# Patient Record
Sex: Female | Born: 1980 | Hispanic: No | Marital: Married | Smoking: Never smoker
Health system: Southern US, Community
[De-identification: ages and names within clinical notes are randomized; demographics above are authoritative.]

## PROBLEM LIST (undated history)

## (undated) ENCOUNTER — Inpatient Hospital Stay (HOSPITAL_COMMUNITY): Payer: Self-pay

## (undated) DIAGNOSIS — Z789 Other specified health status: Secondary | ICD-10-CM

## (undated) HISTORY — PX: NO PAST SURGERIES: SHX2092

---

## 2003-04-01 ENCOUNTER — Ambulatory Visit (HOSPITAL_COMMUNITY): Admission: RE | Admit: 2003-04-01 | Discharge: 2003-04-01 | Payer: Self-pay | Admitting: *Deleted

## 2003-05-31 ENCOUNTER — Inpatient Hospital Stay (HOSPITAL_COMMUNITY): Admission: AD | Admit: 2003-05-31 | Discharge: 2003-05-31 | Payer: Self-pay | Admitting: *Deleted

## 2003-06-04 ENCOUNTER — Inpatient Hospital Stay (HOSPITAL_COMMUNITY): Admission: AD | Admit: 2003-06-04 | Discharge: 2003-06-04 | Payer: Self-pay | Admitting: Obstetrics & Gynecology

## 2003-06-09 ENCOUNTER — Inpatient Hospital Stay (HOSPITAL_COMMUNITY): Admission: AD | Admit: 2003-06-09 | Discharge: 2003-06-11 | Payer: Self-pay | Admitting: Obstetrics and Gynecology

## 2003-06-09 ENCOUNTER — Inpatient Hospital Stay (HOSPITAL_COMMUNITY): Admission: AD | Admit: 2003-06-09 | Discharge: 2003-06-09 | Payer: Self-pay | Admitting: *Deleted

## 2013-11-19 NOTE — L&D Delivery Note (Signed)
Delivery Note Patient is 33 y.o. [id number] with chorioamnionitis   At 11:38 PM a viable female was delivered via Vaginal, Spontaneous Delivery (Presentation: ; Occiput Anterior).  APGAR: 8, 9; weight 6 lb 9.6 oz (2995 g).   Placenta status: Intact, Spontaneous.  Cord: 3 vessels with the following complications: None.  Anesthesia: None  Episiotomy: None Lacerations: 1st degree;Perineal Suture Repair: 4.0 monocryl figure of eight x 1 Est. Blood Loss (mL): 200  Mom to postpartum.  Baby to Couplet care / Skin to Skin.  Bethani Brugger ROCIO 09/11/2014, 3:50 AM

## 2014-02-08 ENCOUNTER — Other Ambulatory Visit: Payer: Self-pay

## 2014-04-06 ENCOUNTER — Other Ambulatory Visit: Payer: Self-pay | Admitting: Obstetrics & Gynecology

## 2014-04-06 DIAGNOSIS — O3680X0 Pregnancy with inconclusive fetal viability, not applicable or unspecified: Secondary | ICD-10-CM

## 2014-04-07 ENCOUNTER — Ambulatory Visit (INDEPENDENT_AMBULATORY_CARE_PROVIDER_SITE_OTHER): Payer: Self-pay

## 2014-04-07 ENCOUNTER — Other Ambulatory Visit: Payer: Self-pay | Admitting: Obstetrics & Gynecology

## 2014-04-07 DIAGNOSIS — O0930 Supervision of pregnancy with insufficient antenatal care, unspecified trimester: Secondary | ICD-10-CM

## 2014-04-07 DIAGNOSIS — O3680X0 Pregnancy with inconclusive fetal viability, not applicable or unspecified: Secondary | ICD-10-CM

## 2014-04-07 NOTE — Progress Notes (Signed)
U/S(18+2wks)-single active fetus, meas c/w LMP dates, cx appears closed (4.1cm), bilateral adnexa appears wnl, FHR- 159 bpm, posterior Gr 0 placenta, no major abnl noted female fetus

## 2014-04-14 ENCOUNTER — Encounter: Payer: Self-pay | Admitting: Adult Health

## 2014-04-14 ENCOUNTER — Encounter: Payer: Self-pay | Admitting: *Deleted

## 2014-05-27 ENCOUNTER — Other Ambulatory Visit (HOSPITAL_COMMUNITY): Payer: Self-pay | Admitting: Nurse Practitioner

## 2014-05-27 DIAGNOSIS — Z3689 Encounter for other specified antenatal screening: Secondary | ICD-10-CM

## 2014-05-27 LAB — OB RESULTS CONSOLE HEPATITIS B SURFACE ANTIGEN: Hepatitis B Surface Ag: NEGATIVE

## 2014-05-27 LAB — OB RESULTS CONSOLE RPR: RPR: NONREACTIVE

## 2014-05-27 LAB — OB RESULTS CONSOLE RUBELLA ANTIBODY, IGM: Rubella: IMMUNE

## 2014-05-27 LAB — OB RESULTS CONSOLE ANTIBODY SCREEN: Antibody Screen: NEGATIVE

## 2014-05-27 LAB — OB RESULTS CONSOLE HIV ANTIBODY (ROUTINE TESTING): HIV: NONREACTIVE

## 2014-05-27 LAB — OB RESULTS CONSOLE ABO/RH: RH TYPE: POSITIVE

## 2014-06-01 ENCOUNTER — Encounter (HOSPITAL_COMMUNITY): Payer: Self-pay

## 2014-06-01 ENCOUNTER — Ambulatory Visit (HOSPITAL_COMMUNITY)
Admission: RE | Admit: 2014-06-01 | Discharge: 2014-06-01 | Disposition: A | Discharge status: Home / Self Care | Payer: Self-pay | Source: Ambulatory Visit | Attending: Nurse Practitioner | Admitting: Nurse Practitioner

## 2014-06-01 ENCOUNTER — Other Ambulatory Visit (HOSPITAL_COMMUNITY): Payer: Self-pay | Admitting: Nurse Practitioner

## 2014-06-01 DIAGNOSIS — Z3689 Encounter for other specified antenatal screening: Secondary | ICD-10-CM | POA: Insufficient documentation

## 2014-08-13 LAB — OB RESULTS CONSOLE GBS: GBS: NEGATIVE

## 2014-09-06 ENCOUNTER — Inpatient Hospital Stay (HOSPITAL_COMMUNITY)
Admission: AD | Admit: 2014-09-06 | Discharge: 2014-09-06 | Disposition: A | Discharge status: Home / Self Care | Payer: Self-pay | Source: Ambulatory Visit | Attending: Family Medicine | Admitting: Family Medicine

## 2014-09-06 ENCOUNTER — Other Ambulatory Visit (HOSPITAL_COMMUNITY): Payer: Self-pay | Admitting: Physician Assistant

## 2014-09-06 ENCOUNTER — Encounter (HOSPITAL_COMMUNITY): Payer: Self-pay

## 2014-09-06 DIAGNOSIS — Z3A40 40 weeks gestation of pregnancy: Secondary | ICD-10-CM | POA: Insufficient documentation

## 2014-09-06 DIAGNOSIS — O471 False labor at or after 37 completed weeks of gestation: Secondary | ICD-10-CM | POA: Insufficient documentation

## 2014-09-06 DIAGNOSIS — O480 Post-term pregnancy: Secondary | ICD-10-CM

## 2014-09-06 HISTORY — DX: Other specified health status: Z78.9

## 2014-09-06 NOTE — Discharge Instructions (Signed)
Tercer trimestre de embarazo (Third Trimester of Pregnancy) El tercer trimestre va desde la semana29 hasta la 42, desde el sptimo hasta el noveno mes, y es la poca en la que el feto crece ms rpidamente. Hacia el final del noveno mes, el feto mide alrededor de 20pulgadas (45cm) de largo y pesa entre 6 y 10 libras (2,700 y 4,500kg).  CAMBIOS EN EL ORGANISMO Su organismo atraviesa por muchos cambios durante el embarazo, y estos varan de una mujer a otra.   Seguir aumentando de peso. Es de esperar que aumente entre 25 y 35libras (11 y 16kg) hacia el final del embarazo.  Podrn aparecer las primeras estras en las caderas, el abdomen y las mamas.  Puede tener necesidad de orinar con ms frecuencia porque el feto baja hacia la pelvis y ejerce presin sobre la vejiga.  Debido al embarazo podr sentir acidez estomacal con frecuencia.  Puede estar estreida, ya que ciertas hormonas enlentecen los movimientos de los msculos que empujan los desechos a travs de los intestinos.  Pueden aparecer hemorroides o abultarse e hincharse las venas (venas varicosas).  Puede sentir dolor plvico debido al aumento de peso y a que las hormonas del embarazo relajan las articulaciones entre los huesos de la pelvis. El dolor de espalda puede ser consecuencia de la sobrecarga de los msculos que soportan la postura.  Tal vez haya cambios en el cabello que pueden incluir su engrosamiento, crecimiento rpido y cambios en la textura. Adems, a algunas mujeres se les cae el cabello durante o despus del embarazo, o tienen el cabello seco o fino. Lo ms probable es que el cabello se le normalice despus del nacimiento del beb.  Las mamas seguirn creciendo y le dolern. A veces, puede haber una secrecin amarilla de las mamas llamada calostro.  El ombligo puede salir hacia afuera.  Puede sentir que le falta el aire debido a que se expande el tero.  Puede notar que el feto "baja" o lo siente ms bajo, en el  abdomen.  Puede tener una prdida de secrecin mucosa con sangre. Esto suele ocurrir en el trmino de unos pocos das a una semana antes de que comience el trabajo de parto.  El cuello del tero se vuelve delgado y blando (se borra) cerca de la fecha de parto. QU DEBE ESPERAR EN LOS EXMENES PRENATALES  Le harn exmenes prenatales cada 2semanas hasta la semana36. A partir de ese momento le harn exmenes semanales. Durante una visita prenatal de rutina:  La pesarn para asegurarse de que usted y el feto estn creciendo normalmente.  Le tomarn la presin arterial.  Le medirn el abdomen para controlar el desarrollo del beb.  Se escucharn los latidos cardacos fetales.  Se evaluarn los resultados de los estudios solicitados en visitas anteriores.  Le revisarn el cuello del tero cuando est prxima la fecha de parto para controlar si este se ha borrado. Alrededor de la semana36, el mdico le revisar el cuello del tero. Al mismo tiempo, realizar un anlisis de las secreciones del tejido vaginal. Este examen es para determinar si hay un tipo de bacteria, estreptococo Grupo B. El mdico le explicar esto con ms detalle. El mdico puede preguntarle lo siguiente:  Cmo le gustara que fuera el parto.  Cmo se siente.  Si siente los movimientos del beb.  Si ha tenido sntomas anormales, como prdida de lquido, sangrado, dolores de cabeza intensos o clicos abdominales.  Si tiene alguna pregunta. Otros exmenes o estudios de deteccin que pueden realizarse   durante el tercer trimestre incluyen lo siguiente:  Anlisis de sangre para controlar las concentraciones de hierro (anemia).  Controles fetales para determinar su salud, nivel de actividad y crecimiento. Si tiene alguna enfermedad o hay problemas durante el embarazo, le harn estudios. FALSO TRABAJO DE PARTO Es posible que sienta contracciones leves e irregulares que finalmente desaparecen. Se llaman contracciones de  Braxton Hicks o falso trabajo de parto. Las contracciones pueden durar horas, das o incluso semanas, antes de que el verdadero trabajo de parto se inicie. Si las contracciones ocurren a intervalos regulares, se intensifican o se hacen dolorosas, lo mejor es que la revise el mdico.  SIGNOS DE TRABAJO DE PARTO   Clicos de tipo menstrual.  Contracciones cada 5minutos o menos.  Contracciones que comienzan en la parte superior del tero y se extienden hacia abajo, a la zona inferior del abdomen y la espalda.  Sensacin de mayor presin en la pelvis o dolor de espalda.  Una secrecin de mucosidad acuosa o con sangre que sale de la vagina. Si tiene alguno de estos signos antes de la semana37 del embarazo, llame a su mdico de inmediato. Debe concurrir al hospital para que la controlen inmediatamente. INSTRUCCIONES PARA EL CUIDADO EN EL HOGAR   Evite fumar, consumir hierbas, beber alcohol y tomar frmacos que no le hayan recetado. Estas sustancias qumicas afectan la formacin y el desarrollo del beb.  Siga las indicaciones del mdico en relacin con el uso de medicamentos. Durante el embarazo, hay medicamentos que son seguros de tomar y otros que no.  Haga actividad fsica solo en la forma indicada por el mdico. Sentir clicos uterinos es un buen signo para detener la actividad fsica.  Contine comiendo alimentos que sanos con regularidad.  Use un sostn que le brinde buen soporte si le duelen las mamas.  No se d baos de inmersin en agua caliente, baos turcos ni saunas.  Colquese el cinturn de seguridad cuando conduzca.  No coma carne cruda ni queso sin cocinar; evite el contacto con las bandejas sanitarias de los gatos y la tierra que estos animales usan. Estos elementos contienen grmenes que pueden causar defectos congnitos en el beb.  Tome las vitaminas prenatales.  Si est estreida, pruebe un laxante suave (si el mdico lo autoriza). Consuma ms alimentos ricos en  fibra, como vegetales y frutas frescos y cereales integrales. Beba gran cantidad de lquido para mantener la orina de tono claro o color amarillo plido.  Dese baos de asiento con agua tibia para aliviar el dolor o las molestias causadas por las hemorroides. Use una crema para las hemorroides si el mdico la autoriza.  Si tiene venas varicosas, use medias de descanso. Eleve los pies durante 15minutos, 3 o 4veces por da. Limite la cantidad de sal en su dieta.  Evite levantar objetos pesados, use zapatos de tacones bajos y mantenga una buena postura.  Descanse con las piernas elevadas si tiene calambres o dolor de cintura.  Visite a su dentista si no lo ha hecho durante el embarazo. Use un cepillo de dientes blando para higienizarse los dientes y psese el hilo dental con suavidad.  Puede seguir manteniendo relaciones sexuales, a menos que el mdico le indique lo contrario.  No haga viajes largos excepto que sea absolutamente necesario y solo con la autorizacin del mdico.  Tome clases prenatales para entender, practicar y hacer preguntas sobre el trabajo de parto y el parto.  Haga un ensayo de la partida al hospital.  Prepare el bolso que   llevar al hospital.  Prepare la habitacin del beb.  Concurra a todas las visitas prenatales segn las indicaciones de su mdico. SOLICITE ATENCIN MDICA SI:  No est segura de que est en trabajo de parto o de que ha roto la bolsa de las aguas.  Tiene mareos.  Siente clicos leves, presin en la pelvis o dolor persistente en el abdomen.  Tiene nuseas, vmitos o diarrea persistentes.  Tiene secrecin vaginal con mal olor.  Siente dolor al orinar. SOLICITE ATENCIN MDICA DE INMEDIATO SI:   Tiene fiebre.  Tiene una prdida de lquido por la vagina.  Tiene sangrado o pequeas prdidas vaginales.  Siente dolor intenso o clicos en el abdomen.  Sube o baja de peso rpidamente.  Tiene dificultad para respirar y siente dolor de  pecho.  Sbitamente se le hinchan mucho el rostro, las manos, los tobillos, los pies o las piernas.  No ha sentido los movimientos del beb durante una hora.  Siente un dolor de cabeza intenso que no se alivia con medicamentos.  Hay cambios en la visin. Document Released: 08/15/2005 Document Revised: 11/10/2013 ExitCare Patient Information 2015 [employer]. This information is not intended to replace advice given to you by your health care provider. Make sure you discuss any questions you have with your health care provider.  

## 2014-09-06 NOTE — Progress Notes (Signed)
I assisted Sarah, RN with questions.  Eda H Royal Interpreter.

## 2014-09-06 NOTE — MAU Note (Addendum)
Per pt and Eda, spanish translator, pt's fourth baby, c/o bloody discharge that is mucusy, irregular ctx's. [facility] No gush of fluid.

## 2014-09-06 NOTE — Progress Notes (Signed)
Dr. Loreta AveAcosta notified of pt in MAU for labor eval. Contractions irregular, pt notin labor. Will d/c home with labor precautions.

## 2014-09-06 NOTE — Progress Notes (Signed)
I assisted Sarah ,RN with discharge instructions. Eda H Royal  Interpreter.

## 2014-09-09 ENCOUNTER — Inpatient Hospital Stay (HOSPITAL_COMMUNITY)
Admission: AD | Admit: 2014-09-09 | Discharge: 2014-09-09 | Disposition: A | Discharge status: Home / Self Care | Payer: Self-pay | Source: Ambulatory Visit | Attending: Obstetrics and Gynecology | Admitting: Obstetrics and Gynecology

## 2014-09-09 ENCOUNTER — Telehealth (HOSPITAL_COMMUNITY): Payer: Self-pay | Admitting: *Deleted

## 2014-09-09 ENCOUNTER — Encounter (HOSPITAL_COMMUNITY): Payer: Self-pay | Admitting: *Deleted

## 2014-09-09 ENCOUNTER — Ambulatory Visit (HOSPITAL_COMMUNITY)
Admission: RE | Admit: 2014-09-09 | Discharge: 2014-09-09 | Disposition: A | Discharge status: Home / Self Care | Payer: Self-pay | Source: Ambulatory Visit | Attending: Physician Assistant | Admitting: Physician Assistant

## 2014-09-09 DIAGNOSIS — O26893 Other specified pregnancy related conditions, third trimester: Secondary | ICD-10-CM

## 2014-09-09 DIAGNOSIS — Z3A40 40 weeks gestation of pregnancy: Secondary | ICD-10-CM | POA: Insufficient documentation

## 2014-09-09 DIAGNOSIS — O9989 Other specified diseases and conditions complicating pregnancy, childbirth and the puerperium: Secondary | ICD-10-CM | POA: Insufficient documentation

## 2014-09-09 DIAGNOSIS — O480 Post-term pregnancy: Secondary | ICD-10-CM | POA: Insufficient documentation

## 2014-09-09 DIAGNOSIS — N898 Other specified noninflammatory disorders of vagina: Secondary | ICD-10-CM

## 2014-09-09 LAB — AMNISURE RUPTURE OF MEMBRANE (ROM) NOT AT ARMC: Amnisure ROM: NEGATIVE

## 2014-09-09 NOTE — MAU Note (Signed)
C/o vaginal leaking around 2100 last night;

## 2014-09-09 NOTE — MAU Note (Signed)
Patient states she has been leaking fluid with bloody show during the night and this am. States she was having contractions during the night but none now. Reports good fetal movement.

## 2014-09-09 NOTE — Telephone Encounter (Signed)
Preadmission screen  

## 2014-09-09 NOTE — Discharge Instructions (Signed)
Contracciones de Braxton Hicks °(Braxton Hicks Contractions) °Durante el embarazo, pueden presentarse contracciones uterinas que no siempre indican que está en trabajo de parto.  °¿QUÉ SON LAS CONTRACCIONES DE BRAXTON HICKS?  °Las contracciones que se presentan antes del trabajo de parto se conocen como contracciones de Braxton Hicks o falso trabajo de parto. Hacia el final del embarazo (32 a 34 semanas), estas contracciones pueden aparecen con más frecuencia y volverse más intensas. No corresponden al trabajo de parto verdadero porque estas contracciones no producen el agrandamiento (la dilatación) y el afinamiento del cuello del útero. Algunas veces, es difícil distinguirlas del trabajo de parto verdadero porque en algunos casos pueden ser muy intensas, y las personas tienen diferentes niveles de tolerancia al dolor. No debe sentirse avergonzada si concurre al hospital con falso trabajo de parto. En ocasiones, la única forma de saber si el trabajo de parto es verdadero es que el médico determine si hay cambios en el cuello del útero. °Si no hay problemas prenatales u otras complicaciones de salud asociadas con el embarazo, no habrá inconvenientes si la envían a su casa con falso trabajo de parto y espera que comience el verdadero. °CÓMO DIFERENCIAR EL TRABAJO DE PARTO FALSO DEL VERDADERO °Falso trabajo de parto °· Las contracciones del falso trabajo de parto duran menos y no son tan intensas como las verdaderas. °· Generalmente son irregulares. °· A menudo, se sienten en la parte delantera de la parte baja del abdomen y en la ingle, °· y pueden desaparecer cuando camina o cambia de posición mientras está acostada. °· Las contracciones se vuelven más débiles y su duración es menor a medida que el tiempo transcurre. °· Por lo general, no se hacen progresivamente más intensas, regulares y cercanas entre sí como en el caso del trabajo de parto verdadero. °Verdadero trabajo de parto °· Las contracciones del verdadero  trabajo de parto duran de 30 a 70 segundos, son muy regulares y suelen volverse más intensas, y aumenta su frecuencia. °· No desaparecen cuando camina. °· La molestia generalmente se siente en la parte superior del útero y se extiende hacia la zona inferior del abdomen y hacia la cintura. °· El médico podrá examinarla para determinar si el trabajo de parto es verdadero. El examen mostrará si el cuello del útero se está dilatando y afinando. °LO QUE DEBE RECORDAR °· Continúe haciendo los ejercicios habituales y siga otras indicaciones que el médico le dé. °· Tome todos los medicamentos como le indicó el médico. °· Concurra a las visitas prenatales regulares. °· Coma y beba con moderación si cree que está en trabajo de parto. °· Si las contracciones de Braxton Hicks le provocan incomodidad: °¨ Cambie de posición: si está acostada o descansando, camine; si está caminando, descanse. °¨ Siéntese y descanse en una bañera con agua tibia. °¨ Beba 2 o 3 vasos de agua. La deshidratación puede provocar contracciones. °¨ Respire lenta y profundamente varias veces por hora. °¿CUÁNDO DEBO BUSCAR ASISTENCIA MÉDICA INMEDIATA? °Solicite atención médica de inmediato si: °· Las contracciones se intensifican, se hacen más regulares y cercanas entre sí. °· Tiene una pérdida de líquido por la vagina. °· Tiene fiebre. °· Elimina mucosidad manchada con sangre. °· Tiene una hemorragia vaginal abundante. °· Tiene dolor abdominal permanente. °· Tiene un dolor en la zona lumbar que nunca tuvo antes. °· Siente que la cabeza del bebé empuja hacia abajo y ejerce presión en la zona pélvica. °· El bebé no se mueve tanto como solía. °Document Released: 08/15/2005 Document Revised: 11/10/2013 °ExitCare® Patient   Information 2015 [place]. This information is not intended to replace advice given to you by your health care provider. Make sure you discuss any questions you have with your health care provider.  Evaluacin de los movimientos  fetales  (Fetal Movement Counts) Nombre del paciente: __________________________________________________ Meagan ChapmanFecha de parto estimada: ____________________ Meagan HammanLa evaluacin de los movimientos fetales es muy recomendable en los embarazos de alto riesgo, pero tambin es una buena idea que lo hagan todas las Horseshoe Bendembarazadas. El Firefightermdico le indicar que comience a contarlos a las 28 semanas de Elvastonembarazo. Los movimientos fetales suelen aumentar:   Despus de Animatoruna comida completa.  Despus de la actividad fsica.  Despus de comer o beber Graybar Electricalgo dulce o fro.  En reposo. Preste atencin cuando sienta que el beb est ms activo. Esto le ayudar a notar un patrn de ciclos de vigilia y sueo de su beb y cules son los factores que contribuyen a un aumento de los movimientos fetales. Es importante llevar a cabo un recuento de movimientos fetales, al mismo tiempo cada da, cuando el beb normalmente est ms activo.  CMO CONTAR LOS MOVIMIENTOS FETALES 1. Busque un lugar tranquilo y cmodo para sentarse o recostarse sobre el lado izquierdo. Al recostarse sobre su lado izquierdo, le proporciona una mejor circulacin de [name] y oxgeno al beb. 2. Anote el da y la hora en una hoja de papel o en un diario. 3. Comience contando las pataditas, revoloteos, chasquidos, vueltas o pinchazos en un perodo de 2 horas. Debe sentir al menos 10 movimientos en 2 horas. 4. Si no siente 10 movimientos en 2 horas, espere 2  3 horas y cuente de nuevo. Busque cambios en el patrn o si no cuenta lo suficiente en 2 horas. SOLICITE ATENCIN MDICA SI:   Siente menos de 10 pataditas en 2 horas, en dos intentos.  No hay movimientos durante una hora.  El patrn se modifica o le lleva ms tiempo Art gallery managercada da contar las 10 pataditas.  Siente que el beb no se mueve como lo hace habitualmente. Fecha: ____________ Movimientos: ____________ Meagan BornHora de inicio: ____________ Meagan BornHora de finalizacin: ____________  Meagan NonesFecha: ____________ Movimientos:  ____________ Meagan BornHora de inicio: ____________ Meagan BornHora de finalizacin: ____________  Meagan NonesFecha: ____________ Movimientos: ____________ Meagan BornHora de inicio: ____________ Meagan BornHora de finalizacin: ____________  Meagan NonesFecha: ____________ Movimientos: ____________ Meagan BornHora de inicio: ____________ Meagan BornHora de finalizacin: ____________  Meagan NonesFecha: ____________ Movimientos: ____________ Meagan BornHora de inicio: ____________ Mammie RussianHora de finalizacin: ____________  Meagan NonesFecha: ____________ Movimientos: ____________ Mammie RussianHora de inicio: ____________ Mammie RussianHora de finalizacin: ____________  Meagan NonesFecha: ____________ Movimientos: ____________ Mammie RussianHora de inicio: ____________ Mammie RussianHora de finalizacin: ____________  Meagan NonesFecha: ____________ Movimientos: ____________ Mammie RussianHora de inicio: ____________ Mammie RussianHora de finalizacin: ____________  Meagan NonesFecha: ____________ Movimientos: ____________ Mammie RussianHora de inicio: ____________ Mammie RussianHora de finalizacin: ____________  Meagan NonesFecha: ____________ Movimientos: ____________ Mammie RussianHora de inicio: ____________ Mammie RussianHora de finalizacin: ____________  Meagan NonesFecha: ____________ Movimientos: ____________ Mammie RussianHora de inicio: ____________ Mammie RussianHora de finalizacin: ____________  Meagan NonesFecha: ____________ Movimientos: ____________ Mammie RussianHora de inicio: ____________ Mammie RussianHora de finalizacin: ____________  Meagan NonesFecha: ____________ Movimientos: ____________ Mammie RussianHora de inicio: ____________ Mammie RussianHora de finalizacin: ____________  Meagan NonesFecha: ____________ Movimientos: ____________ Mammie RussianHora de inicio: ____________ Mammie RussianHora de finalizacin: ____________  Meagan NonesFecha: ____________ Movimientos: ____________ Mammie RussianHora de inicio: ____________ Mammie RussianHora de finalizacin: ____________  Meagan NonesFecha: ____________ Movimientos: ____________ Mammie RussianHora de inicio: ____________ Mammie RussianHora de finalizacin: ____________  Meagan NonesFecha: ____________ Movimientos: ____________ Mammie RussianHora de inicio: ____________ Mammie RussianHora de finalizacin: ____________  Meagan NonesFecha: ____________ Movimientos: ____________ Meagan BornHora de inicio: ____________ Mammie RussianHora de finalizacin: ____________  Meagan NonesFecha: ____________ Movimientos: ____________ Mammie RussianHora de inicio: ____________  Mammie RussianHora de finalizacin: ____________  Meagan NonesFecha:  ____________ Movimientos: ____________ Hora de inicio: ____________ Hora de finalización: ____________  °Fecha: ____________ Movimientos: ____________ Hora de inicio: ____________ Hora de finalización: ____________  °Fecha: ____________ Movimientos: ____________ Hora de inicio: ____________ Hora de finalización: ____________  °Fecha: ____________ Movimientos: ____________ Hora de inicio: ____________ Hora de finalización: ____________  °Fecha: ____________ Movimientos: ____________ Hora de inicio: ____________ Hora de finalización: ____________  °Fecha: ____________ Movimientos: ____________ Hora de inicio: ____________ Hora de finalización: ____________  °Fecha: ____________ Movimientos: ____________ Hora de inicio: ____________ Hora de finalización: ____________  °Fecha: ____________ Movimientos: ____________ Hora de inicio: ____________ Hora de finalización: ____________  °Fecha: ____________ Movimientos: ____________ Hora de inicio: ____________ Hora de finalización: ____________  °Fecha: ____________ Movimientos: ____________ Hora de inicio: ____________ Hora de finalización: ____________  °Fecha: ____________ Movimientos: ____________ Hora de inicio: ____________ Hora de finalización: ____________  °Fecha: ____________ Movimientos: ____________ Hora de inicio: ____________ Hora de finalización: ____________  °Fecha: ____________ Movimientos: ____________ Hora de inicio: ____________ Hora de finalización: ____________  °Fecha: ____________ Movimientos: ____________ Hora de inicio: ____________ Hora de finalización: ____________  °Fecha: ____________ Movimientos: ____________ Hora de inicio: ____________ Hora de finalización: ____________  °Fecha: ____________ Movimientos: ____________ Hora de inicio: ____________ Hora de finalización: ____________  °Fecha: ____________ Movimientos: ____________ Hora de inicio: ____________ Hora de finalización: ____________  °Fecha:  ____________ Movimientos: ____________ Hora de inicio: ____________ Hora de finalización: ____________  °Fecha: ____________ Movimientos: ____________ Hora de inicio: ____________ Hora de finalización: ____________  °Fecha: ____________ Movimientos: ____________ Hora de inicio: ____________ Hora de finalización: ____________  °Fecha: ____________ Movimientos: ____________ Hora de inicio: ____________ Hora de finalización: ____________  °Fecha: ____________ Movimientos: ____________ Hora de inicio: ____________ Hora de finalización: ____________  °Fecha: ____________ Movimientos: ____________ Hora de inicio: ____________ Hora de finalización: ____________  °Fecha: ____________ Movimientos: ____________ Hora de inicio: ____________ Hora de finalización: ____________  °Fecha: ____________ Movimientos: ____________ Hora de inicio: ____________ Hora de finalización: ____________  °Fecha: ____________ Movimientos: ____________ Hora de inicio: ____________ Hora de finalización: ____________  °Fecha: ____________ Movimientos: ____________ Hora de inicio: ____________ Hora de finalización: ____________  °Fecha: ____________ Movimientos: ____________ Hora de inicio: ____________ Hora de finalización: ____________  °Fecha: ____________ Movimientos: ____________ Hora de inicio: ____________ Hora de finalización: ____________  °Fecha: ____________ Movimientos: ____________ Hora de inicio: ____________ Hora de finalización: ____________  °Fecha: ____________ Movimientos: ____________ Hora de inicio: ____________ Hora de finalización: ____________  °Fecha: ____________ Movimientos: ____________ Hora de inicio: ____________ Hora de finalización: ____________  °Fecha: ____________ Movimientos: ____________ Hora de inicio: ____________ Hora de finalización: ____________  °Fecha: ____________ Movimientos: ____________ Hora de inicio: ____________ Hora de finalización: ____________  °Fecha: ____________ Movimientos: ____________ Hora  de inicio: ____________ Hora de finalización: ____________  °Fecha: ____________ Movimientos: ____________ Hora de inicio: ____________ Hora de finalización: ____________  °Fecha: ____________ Movimientos: ____________ Hora de inicio: ____________ Hora de finalización: ____________  °Document Released: 02/12/2008 Document Revised: 10/22/2012 °ExitCare® Patient Information ©2015 [employer], LLC. This information is not intended to replace advice given to you by your health care provider. Make sure you discuss any questions you have with your health care provider. ° °

## 2014-09-10 ENCOUNTER — Inpatient Hospital Stay (HOSPITAL_COMMUNITY)
Admission: AD | Admit: 2014-09-10 | Discharge: 2014-09-12 | DRG: 775 | Disposition: A | Discharge status: Home / Self Care | Payer: Medicaid Other | Source: Ambulatory Visit | Attending: Obstetrics and Gynecology | Admitting: Obstetrics and Gynecology

## 2014-09-10 ENCOUNTER — Encounter (HOSPITAL_COMMUNITY): Payer: Self-pay | Admitting: *Deleted

## 2014-09-10 DIAGNOSIS — O09523 Supervision of elderly multigravida, third trimester: Secondary | ICD-10-CM

## 2014-09-10 DIAGNOSIS — O4290 Premature rupture of membranes, unspecified as to length of time between rupture and onset of labor, unspecified weeks of gestation: Secondary | ICD-10-CM | POA: Diagnosis present

## 2014-09-10 DIAGNOSIS — Z3A40 40 weeks gestation of pregnancy: Secondary | ICD-10-CM | POA: Diagnosis present

## 2014-09-10 DIAGNOSIS — O411230 Chorioamnionitis, third trimester, not applicable or unspecified: Secondary | ICD-10-CM | POA: Diagnosis present

## 2014-09-10 DIAGNOSIS — O471 False labor at or after 37 completed weeks of gestation: Secondary | ICD-10-CM | POA: Diagnosis present

## 2014-09-10 LAB — POCT FERN TEST: POCT Fern Test: POSITIVE

## 2014-09-10 LAB — CBC
HCT: 33.6 % — ABNORMAL LOW (ref 36.0–46.0)
Hemoglobin: 11.6 g/dL — ABNORMAL LOW (ref 12.0–15.0)
MCH: 29.8 pg (ref 26.0–34.0)
MCHC: 34.5 g/dL (ref 30.0–36.0)
MCV: 86.4 fL (ref 78.0–100.0)
Platelets: 149 10*3/uL — ABNORMAL LOW (ref 150–400)
RBC: 3.89 MIL/uL (ref 3.87–5.11)
RDW: 14 % (ref 11.5–15.5)
WBC: 15.2 10*3/uL — ABNORMAL HIGH (ref 4.0–10.5)

## 2014-09-10 MED ORDER — LIDOCAINE HCL (PF) 1 % IJ SOLN
30.0000 mL | INTRAMUSCULAR | Status: DC | PRN
  Administered 2014-09-10: 30 mL via SUBCUTANEOUS
  Filled 2014-09-10: qty 30

## 2014-09-10 MED ORDER — OXYTOCIN BOLUS FROM INFUSION: 500.0000 mL | INTRAVENOUS | Status: DC

## 2014-09-10 MED ORDER — OXYCODONE-ACETAMINOPHEN 5-325 MG PO TABS: 2.0000 | ORAL_TABLET | ORAL | Status: DC | PRN

## 2014-09-10 MED ORDER — CITRIC ACID-SODIUM CITRATE 334-500 MG/5ML PO SOLN
30.0000 mL | ORAL | Status: DC | PRN
Start: 2014-09-10 — End: 2014-09-11

## 2014-09-10 MED ORDER — LACTATED RINGERS IV SOLN
INTRAVENOUS | Status: DC
  Administered 2014-09-10: 22:00:00 via INTRAVENOUS

## 2014-09-10 MED ORDER — ACETAMINOPHEN 500 MG PO TABS: 1000.0000 mg | ORAL_TABLET | Freq: Four times a day (QID) | ORAL | Status: DC | PRN

## 2014-09-10 MED ORDER — GENTAMICIN SULFATE 40 MG/ML IJ SOLN
170.0000 mg | Freq: Three times a day (TID) | INTRAVENOUS | Status: DC
  Filled 2014-09-10 (×2): qty 4.25

## 2014-09-10 MED ORDER — GENTAMICIN SULFATE 40 MG/ML IJ SOLN
180.0000 mg | Freq: Once | INTRAVENOUS | Status: DC
  Filled 2014-09-10: qty 4.5

## 2014-09-10 MED ORDER — OXYTOCIN 40 UNITS IN LACTATED RINGERS INFUSION - SIMPLE MED
62.5000 mL/h | INTRAVENOUS | Status: DC
  Administered 2014-09-10: 999 mL/h via INTRAVENOUS
  Filled 2014-09-10: qty 1000

## 2014-09-10 MED ORDER — LACTATED RINGERS IV SOLN
500.0000 mL | INTRAVENOUS | Status: DC | PRN
  Administered 2014-09-10: 1000 mL via INTRAVENOUS

## 2014-09-10 MED ORDER — ONDANSETRON HCL 4 MG/2ML IJ SOLN
4.0000 mg | Freq: Four times a day (QID) | INTRAMUSCULAR | Status: DC | PRN
Start: 2014-09-10 — End: 2014-09-11

## 2014-09-10 MED ORDER — ACETAMINOPHEN 500 MG PO TABS: 1000.0000 mg | ORAL_TABLET | Freq: Once | ORAL | Status: DC

## 2014-09-10 MED ORDER — ACETAMINOPHEN 325 MG PO TABS
650.0000 mg | ORAL_TABLET | ORAL | Status: DC | PRN
  Administered 2014-09-10: 650 mg via ORAL
  Filled 2014-09-10: qty 2

## 2014-09-10 MED ORDER — SODIUM CHLORIDE 0.9 % IV SOLN
2.0000 g | Freq: Four times a day (QID) | INTRAVENOUS | Status: DC
  Administered 2014-09-10: 2 g via INTRAVENOUS
  Filled 2014-09-10 (×3): qty 2000

## 2014-09-10 MED ORDER — OXYCODONE-ACETAMINOPHEN 5-325 MG PO TABS
1.0000 | ORAL_TABLET | ORAL | Status: DC | PRN
Start: 2014-09-10 — End: 2014-09-11

## 2014-09-10 NOTE — MAU Note (Signed)
Pt reports she has been having ctx on and off all day got stronger. Water broke on the way here. Good fetal movement reported.

## 2014-09-10 NOTE — H&P (Signed)
Attestation of Attending Supervision of Advanced Practitioner: Evaluation and management procedures were performed by the PA/NP/CNM/OB Fellow under my supervision/collaboration. Chart reviewed and agree with management and plan.  Reyes Fifield V 09/10/2014 10:22 PM

## 2014-09-10 NOTE — H&P (Signed)
LABOR ADMISSION HISTORY AND PHYSICAL  Meagan Moore is a 33 y.o. female [phone] with IUP at 313w4d presenting w/ rupture of membranes. She reports +FMs, No LOF, no VB, no blurry vision, headaches or peripheral edema, and RUQ pain. She desires an epidural for labor pain control. She plans on breast feeding. She request nexplanon for birth control.  Estimated Date of Delivery: 09/06/14   Prenatal History/Complications:  Past Medical History: Past Medical History  Diagnosis Date  . Medical history non-contributory     Past Surgical History: Past Surgical History  Procedure Laterality Date  . No past surgeries      Obstetrical History: OB History   Grav Para Term Preterm Abortions TAB SAB Ect Mult Living   4 3 3       3       Social History: History   Social History  . Marital Status: Single    Spouse Name: N/A    Number of Children: N/A  . Years of Education: N/A   Social History Main Topics  . Smoking status: Never Smoker   . Smokeless tobacco: None  . Alcohol Use: No  . Drug Use: No  . Sexual Activity: None   Other Topics Concern  . None   Social History Narrative  . None    Family History: Family History  Problem Relation Age of Onset  . Alcohol abuse Neg Hx   . Arthritis Neg Hx   . Asthma Neg Hx   . Birth defects Neg Hx   . Cancer Neg Hx   . COPD Neg Hx   . Depression Neg Hx   . Diabetes Neg Hx   . Drug abuse Neg Hx   . Early death Neg Hx   . Hearing loss Neg Hx   . Heart disease Neg Hx   . Hyperlipidemia Neg Hx   . Hypertension Neg Hx   . Kidney disease Neg Hx   . Learning disabilities Neg Hx   . Mental illness Neg Hx   . Mental retardation Neg Hx   . Miscarriages / Stillbirths Neg Hx   . Stroke Neg Hx   . Vision loss Neg Hx   . Varicose Veins Neg Hx     Allergies: No Known Allergies  Prescriptions prior to admission  Medication Sig Dispense Refill  . loratadine (CLARITIN) 10 MG tablet Take 10 mg by mouth daily.      .  Prenatal Vit-Fe Fumarate-FA (PRENATAL MULTIVITAMIN) TABS tablet Take 1 tablet by mouth daily at 12 noon.         Review of Systems   All systems reviewed and negative except as stated in HPI  Blood pressure 123/52, pulse 118, temperature 99.5 F (37.5 C), resp. rate 20, last menstrual period 11/30/2013. General appearance: alert and cooperative Lungs: clear to auscultation bilaterally Heart: regular rate and rhythm Abdomen: soft, non-tender; bowel sounds normal Pelvic: adequate Extremities: Homans sign is negative, no sign of DVT Presentation: cephalic Fetal monitoringBaseline: 170 bpm, Variability: Good {> 6 bpm), Accelerations: Reactive and Decelerations: few variables Uterine activity q472min   Dilation: 1 Effacement (%): 50 Station: -2 Exam by:: Vira BlancoShimica Robinson, RN   Prenatal labs: ABO, Rh: O/Positive/-- (07/09 0000) Antibody: Negative (07/09 0000) Rubella:   RPR: Nonreactive (07/09 0000)  HBsAg: Negative (07/09 0000)  HIV: Non-reactive (07/09 0000)  GBS: Negative (09/25 0000)  1 hr Glucola 101 Genetic screening  Neg first screen and quad Anatomy US normal   Results for orders placed during the  hospital encounter of 09/10/14 (from the past 24 hour(s))  POCT FERN TEST   Collection Time    09/10/14  9:28 PM      Result Value Ref Range   POCT Fern Test Positive = ruptured amniotic membanes      There are no active problems to display for this patient.   Assessment: Meagan Moore is a 33 y.o. [phone] at 4072w4d here for spontaneous onset of labor and rupture of membranes  #Labor: progressing normally, if needed will augment with pitocin #Pain: Epidural upon request #FWB: cat II with baseline 170 #ID:  GBS neg #MOF: breast #MOC:nexplanon  Saul Dorsi ROCIO 09/10/2014, 10:02 PM

## 2014-09-10 NOTE — Progress Notes (Signed)
ANTIBIOTIC CONSULT NOTE - INITIAL  Pharmacy Consult for Gentamicin Indication: Chorioamnionitis  No Known Allergies  Patient Measurements: Height: 5\' 7"  (170.2 cm) Weight: 196 lb (88.905 kg) IBW/kg (Calculated) : 61.6 Adjusted Body Weight: 69.8 kg  Vital Signs: Temp: 99.5 F (37.5 C) (10/23 2047) BP: 135/72 mmHg (10/23 2313) Pulse Rate: 105 (10/23 2324) Intake/Output from previous day:   Intake/Output from this shift:    Labs:  Recent Labs  09/10/14 2150  WBC 15.2*  HGB 11.6*  PLT 149*   CrCl is unknown because no creatinine reading has been taken. No results found for this basename: VANCOTROUGH, Leodis BinetVANCOPEAK, VANCORANDOM, GENTTROUGH, GENTPEAK, GENTRANDOM, TOBRATROUGH, TOBRAPEAK, TOBRARND, AMIKACINPEAK, AMIKACINTROU, AMIKACIN,  in the last 72 hours   Microbiology: Recent Results (from the past 720 hour(s))  OB RESULTS CONSOLE GBS     Status: None   Collection Time    08/13/14 12:00 AM      Result Value Ref Range Status   GBS Negative   Final    Medical History: Past Medical History  Diagnosis Date  . Medical history non-contributory     Medications:  Ampicillin 2 Gm IV every 6 hours  Assessment: 33 yo G4P3003 at 106104w4d admitted for SROM. Pt now in labor with increased temperature and presumed chorioamnionitis.  Goal of Therapy:  Gentamicin peaks 6-8 mcg/ml; troughs <1 mcg/ml  Plan:  Gentamicin 180 mg IV loading dose; then 170 mg IV every 8 hours. Monitor serum creatinine per protocol Serum gentamicin levels as indicated.  Arelia SneddonMason, Terryon Pineiro Anne 09/10/2014,11:42 PM

## 2014-09-11 ENCOUNTER — Encounter (HOSPITAL_COMMUNITY): Payer: Self-pay | Admitting: *Deleted

## 2014-09-11 LAB — TYPE AND SCREEN
ABO/RH(D): O POS
ANTIBODY SCREEN: NEGATIVE

## 2014-09-11 LAB — HIV ANTIBODY (ROUTINE TESTING W REFLEX): HIV 1&2 Ab, 4th Generation: NONREACTIVE

## 2014-09-11 LAB — ABO/RH: ABO/RH(D): O POS

## 2014-09-11 LAB — RPR

## 2014-09-11 MED ORDER — DIPHENHYDRAMINE HCL 25 MG PO CAPS: 25.0000 mg | ORAL_CAPSULE | Freq: Four times a day (QID) | ORAL | Status: DC | PRN

## 2014-09-11 MED ORDER — BENZOCAINE-MENTHOL 20-0.5 % EX AERO: 1.0000 "application " | INHALATION_SPRAY | CUTANEOUS | Status: DC | PRN

## 2014-09-11 MED ORDER — INFLUENZA VAC SPLIT QUAD 0.5 ML IM SUSY: 0.5000 mL | PREFILLED_SYRINGE | INTRAMUSCULAR | Status: DC

## 2014-09-11 MED ORDER — IBUPROFEN 600 MG PO TABS
600.0000 mg | ORAL_TABLET | Freq: Four times a day (QID) | ORAL | Status: DC | PRN
Start: 2014-09-11 — End: 2014-09-12
  Administered 2014-09-11 – 2014-09-12 (×4): 600 mg via ORAL
  Filled 2014-09-11 (×5): qty 1

## 2014-09-11 MED ORDER — SIMETHICONE 80 MG PO CHEW: 80.0000 mg | CHEWABLE_TABLET | ORAL | Status: DC | PRN

## 2014-09-11 MED ORDER — INFLUENZA VAC SPLIT QUAD 0.5 ML IM SUSY
0.5000 mL | PREFILLED_SYRINGE | INTRAMUSCULAR | Status: AC
  Administered 2014-09-11: 0.5 mL via INTRAMUSCULAR

## 2014-09-11 MED ORDER — PRENATAL MULTIVITAMIN CH
1.0000 | ORAL_TABLET | Freq: Every day | ORAL | Status: DC
  Administered 2014-09-11 – 2014-09-12 (×2): 1 via ORAL
  Filled 2014-09-11 (×2): qty 1

## 2014-09-11 MED ORDER — IBUPROFEN 100 MG PO CHEW: 600.0000 mg | CHEWABLE_TABLET | Freq: Three times a day (TID) | ORAL | Status: DC

## 2014-09-11 MED ORDER — OXYCODONE-ACETAMINOPHEN 5-325 MG PO TABS: 1.0000 | ORAL_TABLET | ORAL | Status: DC | PRN

## 2014-09-11 MED ORDER — WITCH HAZEL-GLYCERIN EX PADS: 1.0000 "application " | MEDICATED_PAD | CUTANEOUS | Status: DC | PRN

## 2014-09-11 MED ORDER — ONDANSETRON HCL 4 MG/2ML IJ SOLN: 4.0000 mg | INTRAMUSCULAR | Status: DC | PRN

## 2014-09-11 MED ORDER — IBUPROFEN 600 MG PO TABS
600.0000 mg | ORAL_TABLET | Freq: Four times a day (QID) | ORAL | Status: DC
  Administered 2014-09-11: 600 mg via ORAL
  Filled 2014-09-11: qty 1

## 2014-09-11 MED ORDER — LANOLIN HYDROUS EX OINT: TOPICAL_OINTMENT | CUTANEOUS | Status: DC | PRN

## 2014-09-11 MED ORDER — ONDANSETRON HCL 4 MG PO TABS: 4.0000 mg | ORAL_TABLET | ORAL | Status: DC | PRN

## 2014-09-11 MED ORDER — TETANUS-DIPHTH-ACELL PERTUSSIS 5-2.5-18.5 LF-MCG/0.5 IM SUSP
0.5000 mL | Freq: Once | INTRAMUSCULAR | Status: DC
Start: 2014-09-12 — End: 2014-09-12

## 2014-09-11 MED ORDER — IBUPROFEN 600 MG PO TABS: 600.0000 mg | ORAL_TABLET | Freq: Four times a day (QID) | ORAL | Status: DC

## 2014-09-11 MED ORDER — DIBUCAINE 1 % RE OINT: 1.0000 "application " | TOPICAL_OINTMENT | RECTAL | Status: DC | PRN

## 2014-09-11 MED ORDER — ZOLPIDEM TARTRATE 5 MG PO TABS: 5.0000 mg | ORAL_TABLET | Freq: Every evening | ORAL | Status: DC | PRN

## 2014-09-11 MED ORDER — SENNOSIDES-DOCUSATE SODIUM 8.6-50 MG PO TABS
2.0000 | ORAL_TABLET | ORAL | Status: DC
  Administered 2014-09-11: 2 via ORAL
  Filled 2014-09-11: qty 2

## 2014-09-11 NOTE — Progress Notes (Signed)
Post Partum Day 1 Subjective:  Markus Jarvisvangelina T Maldonado is a 33 y.o. [id number] 4061w4d s/p SVD.  No acute events overnight.  Pt denies problems with  voiding or po intake.  She denies nausea or vomiting.  Pain is well controlled.  Plan for birth control is nexplanon.  Method of Feeding: breast  Objective: Blood pressure 107/62, pulse 82, temperature 98.3 F (36.8 C), temperature source Oral, resp. rate 20, height 5\' 7"  (1.702 m), weight 196 lb (88.905 kg), last menstrual period 11/30/2013, SpO2 100.00%, unknown if currently breastfeeding.  Physical Exam:  General: alert, cooperative and no distress Lochia:normal flow Chest: CTAB Heart: RRR no m/r/g Abdomen: +BS, soft, nontender,  Uterine Fundus: firm DVT Evaluation: No evidence of DVT seen on physical exam. Extremities: 1+ edema   Recent Labs  09/10/14 2150  HGB 11.6*  HCT 33.6*    Assessment/Plan:  ASSESSMENT: Markus Jarvisvangelina T Maldonado is a 33 y.o. [id number] 8061w4d s/p SVD  Plan for discharge tomorrow   LOS: 1 day   Ellias Mcelreath ROCIO 09/11/2014, 7:26 AM

## 2014-09-11 NOTE — Lactation Note (Signed)
This note was copied from the chart of Meagan Moore. Lactation Consultation Note  Patient Name: Meagan Moore [id number]'s Date: 09/11/2014 Reason for consult: Initial assessment of this mother/baby dyad at 20 hours postpartum.  Mom is an experienced breastfeeding mom, stating that she breastfed 3 other children for >1 year each.  Per mom, this newborn is latching well and she informs LC (who speaks Spanish) that she knows how to hand express her colostrum/milk.  LC encouraged frequent STS and at time of visit, baby is not cuing but is laying STS against mom's right breast.  Mom declines offer of interpreter, stating that she understands all the information [facility] by [facility].  Mom has been given written information in both [place] and [place].  LC encouraged review of Baby and Me pp 13-16 for review of BF information in [place].LC provided [employer] Resource brochure in Spanish, and reviewed WH services and list of community and web site resources, especially [facility] website which has information available in [place]..   Maternal Data Formula Feeding for Exclusion: No Has patient been taught Hand Expression?: Yes (experienced breastfeeding mom; informs LC that she knows how to hand express) Does the patient have breastfeeding experience prior to this delivery?: Yes  Feeding    LATCH Score/Interventions Latch: Repeated attempts needed to sustain latch, nipple held in mouth throughout feeding, stimulation needed to elicit sucking reflex. Intervention(s): Assist with latch  Audible Swallowing: Spontaneous and intermittent  Type of Nipple: Everted at rest and after stimulation  Comfort (Breast/Nipple): Soft / non-tender     Hold (Positioning): Assistance needed to correctly position infant at breast and maintain latch.  LATCH Score: 8 (most recent LATCH assessment, per RN)  Lactation Tools Discussed/Used   STS, hand expression, cue feedings  Consult Status Consult Status:  Follow-up Date: 09/12/14 Follow-up type: In-patient    Warrick ParisianBryant, Romario Tith [facility] 09/11/2014, 7:43 PM

## 2014-09-11 NOTE — Progress Notes (Signed)
I assisted Biomedical scientistHolly RN from Lactation with some questions and assesment for a baby. I also assisted Shari HeritageSue RN with some questions and assesment for mom , and vital signs. By Orlan LeavensViria Alvarez, Interpreter

## 2014-09-12 ENCOUNTER — Ambulatory Visit: Payer: Self-pay

## 2014-09-12 MED ORDER — IBUPROFEN 600 MG PO TABS: 600.0000 mg | ORAL_TABLET | Freq: Four times a day (QID) | ORAL | Status: DC | PRN

## 2014-09-12 NOTE — Progress Notes (Signed)
Assisted in RN in interpretation of teaching and immunization information.  Meagan Moore - [place] [employer] Interpre[employer]

## 2014-09-12 NOTE — Progress Notes (Signed)
Assisted in ordering patient dinner and breakfast. Joselyn GlassmanBenita Sanchez - Spanish Interpreter

## 2014-09-12 NOTE — Discharge Summary (Signed)
Obstetric Discharge Summary Reason for Admission: rupture of membranes Prenatal Procedures: ultrasound Intrapartum Procedures: spontaneous vaginal delivery Postpartum Procedures: none Complications-Operative and Postpartum: 1st degree perineal laceration Eating, drinking, voiding, ambulating well.  +flatus.  Lochia and pain wnl.  Denies dizziness, lightheadedness, or sob. No complaints. Breastfeeding well.   Hospital Course: Meagan Moore is a 33 y.o. 614P4004 female admited at 3240.4wks w/ SROM/early labor . She progressed rapidly on her own and had an uncomplicated SVB. Her pp course has been uncomplicated.  By PPD#2 she is doing well and is deemed to have received the full benefit of her hospital stay.  Filed Vitals:   09/12/14 0515  BP: 106/53  Pulse: 92  Temp: 97.7 F (36.5 C)  Resp: 18   H/H: Lab Results  Component Value Date/Time   HGB 11.6* 09/10/2014  9:50 PM   HCT 33.6* 09/10/2014  9:50 PM    Physical Exam: General: alert, cooperative and no distress Abdomen/Uterine Fundus: Appropriately tender, non-distended, FF @ U-2 Incision: n/a Lochia: appropriate Extremities: No evidence of DVT seen on physical exam. Negative Homan's sign, no cords, calf tenderness, or significant calf/ankle edema. 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   Discharge Diagnoses: Term Pregnancy-delivered  Discharge Information: Date: 05/31/2011 Activity: pelvic rest Diet: routine  Medications: PNV and Ibuprofen Breast feeding: Yes Contraception: abstinence until nexplanon Circumcision: n/a Condition: stable Instructions: refer to handout Discharge to: home  Infant: Home with mother  Follow-up Information   Schedule an appointment as soon as possible for a visit with [facility] [place]. (4-6 weeks for your postpartum visit and one for your nexplanon insertion)    Contact information:   [address] [place] [id number] [phone]      Marge DuncansBooker, Ryun Velez Randall, CNM, WHNP-BC 09/12/2014,7:18 AM

## 2014-09-12 NOTE — Progress Notes (Signed)
Checked on patients needs and ordered a snack Meagan Moore - [place] [employer] Interpre[employer]

## 2014-09-12 NOTE — Discharge Instructions (Signed)
Cuidados en el postparto luego de un parto vaginal  °(Postpartum Care After Vaginal Delivery) °Después del parto (período de postparto), la estadía normal en el hospital es de 24-72 horas. Si hubo problemas con el trabajo de parto o el parto, o si tiene otros problemas médicos, es posible que deba permanecer en el hospital por más tiempo.  °Mientras esté en el hospital, recibirá ayuda e instrucciones sobre cómo cuidar de usted misma y de su bebé recién nacido durante el postparto.  °Mientras esté en el hospital:  °· Asegúrese de decirle a las enfermeras si siente dolor o malestar, así como donde siente el dolor y qué empeora el dolor. °· Si usted tuvo una incisión cerca de la vagina (episiotomía) o si ha tenido algún desgarro durante el parto, las enfermeras le pondrán hielo sobre la episiotomía o el desgarro. Las bolsas de hielo pueden ayudar a reducir el dolor y la hinchazón. °· Si está amamantando, puede sentir contracciones dolorosas en el útero durante algunas semanas. Esto es normal. Las contracciones ayudan a que el útero vuelva a su tamaño normal. °· Es normal tener algo de sangrado después del parto. °¨ Durante los primeros 1-3 días después del parto, el flujo es de color rojo y la cantidad puede ser similar a un período. °¨ Es frecuente que el flujo se inicie y se detenga. °¨ En los primeros días, puede eliminar algunos coágulos pequeños. Informe a las enfermeras si elimina coágulos grandes o aumenta el flujo. °¨ No  elimine los coágulos de sangre por el inodoro antes de que la enfermera los vea. °¨ Durante los próximos 3 a 10 días después del parto, el flujo debe ser más acuoso y rosado o marrón. °¨ De diez a catorce días después del parto, el flujo debe ser una pequeña cantidad de secreción de color blanco amarillento. °¨ La cantidad de flujo disminuirá en las primeras semanas después del parto. El flujo puede detenerse en 6-8 semanas. La mayoría de las mujeres no tienen más flujo a las 12 semanas  después del parto. °· Usted debe cambiar sus apósitos con frecuencia. °· Lávese bien las manos con agua y jabón durante al menos 20 segundos después de cambiar el apósito, usar el baño o antes de sostener o alimentar a su recién nacido. °· Usted podrá sentir como que tiene que vaciar la vejiga durante las primeras 6-8 horas después del parto. °· En caso de que sienta debilidad, mareo o desmayo, llame a la enfermera antes de levantarse de la cama por primera vez y antes de tomar una ducha por primera vez. °· Dentro de los primeros días después del parto, sus mamas pueden comenzar a estar sensibles y llenas. Esto se llama congestión. La sensibilidad en los senos por lo general desaparece dentro de las 48-72 horas después de que ocurre la congestión. También puede notar que la leche se escapa de sus senos. Si no está amamantando no estimule sus pechos. La estimulación de las mamas hace que sus senos produzcan más leche. °· Pasar tanto tiempo como le sea posible con el bebé recién nacido es muy importante. Durante ese tiempo, usted y su bebé deben sentirse cerca y conocerse uno al otro. Tener al bebé en su habitación (alojamiento conjunto) ayudará a fortalecer el vínculo con el bebé recién nacido. Esto le dará tiempo para conocerlo y atenderlo de manera cómoda. °· Las hormonas se modifican después del parto. A veces, los cambios hormonales pueden causar tristeza o ganas de llorar por un tiempo. Estos sentimientos   no deben durar más de unos pocos días. Si duran más que eso, debe hablar con su médico. °· Si lo desea, hable con su médico acerca de los métodos de planificación familiar o métodos anticonceptivos. °· Hable con su médico acerca de las vacunas. El médico puede indicarle que se aplique las siguientes vacunas antes de salir del hospital: °· Vacuna contra el tétanos, la difteria y la tos ferina (Tdap) o el tétanos y la difteria (Td). Es muy importante que usted y su familia (incluyendo a los abuelos) u otras  personas que cuidan al recién nacido estén al día con las vacunas Tdap o Td. Las vacunas Tdap o Td pueden ayudar a proteger al recién nacido de enfermedades. °· Inmunización contra la rubéola. °· Inmunización contra la varicela. °· Inmunización contra la gripe. Usted debe recibir esta vacunación anual si no la ha recibido durante el embarazo. °Document Released: 09/02/2007 Document Revised: 07/30/2012 °ExitCare® Patient Information ©2015 [employer]. This information is not intended to replace advice given to you by your health care provider. Make sure you discuss any questions you have with your health care provider. ° °Lactancia materna °(Breastfeeding) °Decidir amamantar es una de las mejores elecciones que puede hacer por usted y su bebé. El cambio hormonal durante el embarazo produce el desarrollo del tejido mamario y aumenta la cantidad y el tamaño de los conductos galactóforos. Estas hormonas también permiten que las proteínas, los azúcares y las grasas de la sangre produzcan la leche materna en las glándulas productoras de leche. Las hormonas impiden que la leche materna sea liberada antes del nacimiento del bebé, además de impulsar el flujo de leche luego del nacimiento. Una vez que ha comenzado a amamantar, pensar en el bebé, así como la succión o el llanto, pueden estimular la liberación de leche de las glándulas productoras de leche.  °LOS BENEFICIOS DE AMAMANTAR °Para el bebé °· La primera leche (calostro) ayuda a mejorar el funcionamiento del sistema digestivo del bebé. °· La leche tiene anticuerpos que ayudan a prevenir las infecciones en el bebé. °· El bebé tiene una menor incidencia de asma, alergias y del síndrome de muerte súbita del lactante. °· Los nutrientes en la leche materna son mejores para el bebé que la leche maternizada y están preparados exclusivamente para cubrir las necesidades del bebé. °· La leche materna mejora el desarrollo cerebral del bebé. °· Es menos probable que el bebé  desarrolle otras enfermedades, como obesidad infantil, asma o diabetes mellitus de tipo 2. °Para usted  °· La lactancia materna favorece el desarrollo de un vínculo muy especial entre la madre y el bebé. °· Es conveniente. La leche materna siempre está disponible a la temperatura correcta y es económica. °· La lactancia materna ayuda a quemar calorías y a perder el peso ganado durante el embarazo. °· Favorece la contracción del útero al tamaño que tenía antes del embarazo de manera más rápida y disminuye el sangrado (loquios) después del parto. °· La lactancia materna contribuye a reducir el riesgo de desarrollar diabetes mellitus de tipo 2, osteoporosis o cáncer de mama o de ovario en el futuro. °SIGNOS DE QUE EL BEBÉ ESTÁ HAMBRIENTO °Primeros signos de hambre °· Aumenta su estado de alerta o actividad. °· Se estira. °· Mueve la cabeza de un lado a otro. °· Mueve la cabeza y abre la boca cuando se le toca la mejilla o la comisura de la boca (reflejo de búsqueda). °· Aumenta las vocalizaciones, tales como sonidos de succión, se relame los labios,   emite arrullos, suspiros, o chirridos. °· Mueve la mano hacia la boca. °· Se chupa con ganas los dedos o las manos. °Signos tardíos de hambre °· Está agitado. °· Llora de manera intermitente. °Signos de hambre extrema °Los signos de hambre extrema requerirán que lo calme y lo consuele antes de que el bebé pueda alimentarse adecuadamente. No espere a que se manifiesten los siguientes signos de hambre extrema para comenzar a amamantar:  °· Agitación. °· Llanto intenso y fuerte. °·  Gritos. °INFORMACIÓN BÁSICA SOBRE LA LACTANCIA MATERNA °Iniciación de la lactancia materna °· Encuentre un lugar cómodo para sentarse o acostarse, con un buen respaldo para el cuello y la espalda. °· Coloque una almohada o una manta enrollada debajo del bebé para acomodarlo a la altura de la mama (si está sentada). Las almohadas para amamantar se han diseñado especialmente a fin de servir de apoyo  para los brazos y el bebé mientras amamanta. °· Asegúrese de que el abdomen del bebé esté frente al suyo. °· Masajee suavemente la mama. Con las yemas de los dedos, masajee la pared del pecho hacia el pezón en un movimiento circular. Esto estimula el flujo de leche. Es posible que deba continuar este movimiento mientras amamanta si la leche fluye lentamente. °· Sostenga la mama con el pulgar por arriba del pezón y los otros 4 dedos por debajo de la mama. Asegúrese de que los dedos se encuentren lejos del pezón y de la boca del bebé. °· Empuje suavemente los labios del bebé con el pezón o con el dedo. °· Cuando la boca del bebé se abra lo suficiente, acérquelo rápidamente a la mama e introduzca todo el pezón y la zona oscura que lo rodea (areola), tanto como sea posible, dentro de la boca del bebé. °¨ Debe haber más areola visible por arriba del labio superior del bebé que por debajo del labio inferior. °¨ La lengua del bebé debe estar entre la encía inferior y la mama. °· Asegúrese de que la boca del bebé esté en la posición correcta alrededor del pezón (prendida). Los labios del bebé deben crear un sello sobre la mama y estar doblados hacia afuera (invertidos). °· Es común que el bebé succione durante 2 a 3 minutos para que comience el flujo de leche materna. °Cómo debe prenderse °Es muy importante que le enseñe al bebé cómo prenderse adecuadamente a la mama. Si el bebé no se prende adecuadamente, puede causarle dolor en el pezón y reducir la producción de leche materna, y hacer que el bebé tenga un escaso aumento de peso. Además, si el bebé no se prende adecuadamente al pezón, puede tragar aire durante la alimentación. Esto puede causarle molestias al bebé. Hacer eructar al bebé al cambiar de mama puede ayudarlo a liberar el aire. Sin embargo, enseñarle al bebé cómo prenderse a la mama adecuadamente es la mejor manera de evitar que se sienta molesto por tragar aire mientras se alimenta. °Signos de que el bebé se  ha prendido adecuadamente al pezón:  °· Tironea o succiona de modo silencioso, sin causarle dolor. °· Se escucha que traga cada 3 o 4 succiones. °·  Hay movimientos musculares por arriba y por delante de sus oídos al succionar. °Signos de que el bebé no se ha prendido adecuadamente al pezón:  °· Hace ruidos de succión o de chasquido mientras se alimenta. °· Siente dolor en el pezón. °Si cree que el bebé no se prendió correctamente, deslice el dedo en la comisura de la boca y colóquelo   las encas del beb para interrumpir la succin. Intente comenzar a amamantar nuevamente. Signos de Fish farm managerlactancia materna exitosa Signos del beb:   Disminuye gradualmente el nmero de succiones o cesa la succin por completo.  Se duerme.  Relaja el cuerpo.  Retiene una pequea cantidad de Kindred Healthcareleche en la boca.  Se desprende solo del pecho. Signos que presenta usted:  Las mamas han aumentado la firmeza, el peso y el tamao 1 a 3 horas despus de Museum/gallery exhibitions officeramamantar.  Estn ms blandas inmediatamente despus de amamantar.  Un aumento del volumen de [place] Forestleche, y tambin un cambio en su consistencia y color se producen hacia el quinto da de Tour managerlactancia materna.  Los pezones no duelen, ni estn agrietados ni sangran. Signos de que su beb recibe la cantidad de leche suficiente  Moja al menos 3 paales en 24 horas. La orina debe ser clara y de color amarillo plido a los 5 [address]  [address] de [place].  Defeca al menos 3 veces en 24 horas a los [address]  [address] de [address]. La materia fecal debe ser blanda y [place].  Defeca al menos 3 veces en 24 horas a los [address] de [address]. La materia fecal debe ser grumosa y [place]illenta.  No registra una prdida de peso mayor del 10% del peso al nacer durante los primeros 3 [address]  [address] de [place].  Aumenta de peso un promedio de 4 a 7onzas (113 a 198g) por semana despus de los [address]  [address] de vida.  Aumenta de [place]peso, Allemandiariamente, de [place] Pointmanera uniforme a Glass blower/designerpartir de los [address]  [address] de vida, sin Passenger transport managerregistrar prdida de peso despus de las  2semanas de vida. Despus de alimentarse, es posible que el beb regurgite una pequea cantidad. Esto es frecuente. FRECUENCIA Y DURACIN DE LA LACTANCIA MATERNA El amamantamiento frecuente la ayudar a producir ms [place]leche y a Education officer, communityprevenir problemas de Engineer, miningdolor en los pezones e hinchazn en las [place]amas. Alimente al beb cuando muestre signos de hambre o si siente la necesidad de reducir la congestin de las [place]amas. Esto se denomina "lactancia a demanda". Evite el uso del chupete mientras trabaja para establecer la lactancia (las primeras 4 a 6 semanas despus del nacimiento del beb). Despus de este perodo, podr ofrecerle un chupete. Las investigaciones demostraron que el uso del chupete durante el primer ao de vida del beb disminuye el riesgo de desarrollar el sndrome de muerte sbita del lactante (SMSL). Permita que el nio se alimente en cada mama todo lo que desee. Contine amamantando al beb hasta que haya terminado de alimentarse. Cuando el beb se desprende o se queda dormido mientras se est alimentando de la primera mama, ofrzcale la segunda. Debido a que, con frecuencia, los recin Sunoconacidos permanecen somnolientos las primeras semanas de vida, es posible que deba despertar al beb para alimentarlo. Los horarios de Acupuncturistlactancia varan de un beb a otro. Sin embargo, las siguientes reglas pueden servir como gua para ayudarla a Lawyergarantizar que el beb se alimenta adecuadamente:  Se puede amamantar a los recin nacidos (bebs de 4 semanas o menos de vida) cada 1 a 3 horas.  No deben transcurrir ms de 3 horas durante el da o 5 horas durante la noche sin que se amamante a los recin nacidos.  Debe amamantar al beb 8 veces como mnimo en un perodo de 24 horas, hasta que comience a introducir slidos en su dieta, a los 6 meses de vida aproximadamente. EXTRACCIN DE Dean Foods CompanyLECHE MATERNA La extraccin y Contractorel almacenamiento de la leche materna le permiten asegurarse de que el beb  bebé se alimente exclusivamente de  leche materna, aun en momentos en los que no puede amamantar. Esto tiene especial importancia si debe regresar al trabajo en el período en que aún está amamantando o si no puede estar presente en los momentos en que el bebé debe alimentarse. Su asesor en lactancia puede orientarla sobre cuánto tiempo es seguro almacenar leche materna.  °El sacaleche es un aparato que le permite extraer leche de la mama a un recipiente estéril. Luego, la leche materna extraída puede almacenarse en un refrigerador o congelador. Algunos sacaleches son manuales, mientras que otros son eléctricos. Consulte a su asesor en lactancia qué tipo será más conveniente para usted. Los sacaleches se pueden comprar; sin embargo, algunos hospitales y grupos de apoyo a la lactancia materna alquilan sacaleches mensualmente. Un asesor en lactancia puede enseñarle cómo extraer leche materna manualmente, en caso de que prefiera no usar un sacaleche.  °CÓMO CUIDAR LAS MAMAS DURANTE LA LACTANCIA MATERNA °Los pezones se secan, agrietan y duelen durante la lactancia materna. Las siguientes recomendaciones pueden ayudarla a mantener las mamas humectadas y sanas: °· Evite usar jabón en los pezones. °· Use un sostén de soporte. Aunque no son esenciales, las camisetas sin mangas o los sostenes especiales para amamantar están diseñados para acceder fácilmente a las mamas, para amamantar sin tener que quitarse todo el sostén o la camiseta. Evite usar sostenes con aro o sostenes muy ajustados. °· Seque al aire sus pezones durante 3 a 4 minutos después de amamantar al bebé. °· Utilice solo apósitos de algodón en el sostén para absorber las pérdidas de leche. La pérdida de un poco de leche materna entre las tomas es normal. °· Utilice lanolina sobre los pezones luego de amamantar. La lanolina ayuda a mantener la humedad normal de la piel. Si usa lanolina pura, no tiene que lavarse los pezones antes de volver a alimentar al bebé. La lanolina pura no es tóxica para el  bebé. Además, puede extraer manualmente algunas gotas de leche materna y masajear suavemente esa leche sobre los pezones, para que la leche se seque al aire. °Durante las primeras semanas después de dar a luz, algunas mujeres pueden experimentar hinchazón en las mamas (congestión mamaria). La congestión puede hacer que sienta las mamas pesadas, calientes y sensibles al tacto. El pico de la congestión ocurre dentro de los 3 a 5 días después del parto. Las siguientes recomendaciones pueden ayudarla a aliviar la congestión: °· Vacíe por completo las mamas al amamantar o extraer leche. Puede aplicar calor húmedo en las mamas (en la ducha o con toallas húmedas para manos) antes de amamantar o extraer leche. Esto aumenta la circulación y ayuda a que la leche fluya. Si el bebé no vacía por completo las mamas cuando lo amamanta, extraiga la leche restante después de que haya finalizado. °· Use un sostén ajustado (para amamantar o común) o una camiseta sin mangas durante 1 o 2 días para indicar al cuerpo que disminuya ligeramente la producción de leche. °· Aplique compresas de hielo sobre las mamas, a menos que le resulte demasiado incómodo. °· Asegúrese de que el bebé esté prendido y se encuentre en la posición correcta mientras lo alimenta. °Si la congestión persiste luego de 48 horas o después de seguir estas recomendaciones, comuníquese con su médico o un asesor en lactancia. °RECOMENDACIONES GENERALES PARA EL CUIDADO DE LA SALUD DURANTE LA LACTANCIA MATERNA °· Consuma alimentos saludables. Alterne comidas y colaciones, y coma 3 de cada una por día. Dado que lo que come   afecta la leche materna, es posible que algunas comidas hagan que su bebé se vuelva más irritable de lo habitual. Evite comer este tipo de alimentos si percibe que afectan de manera negativa al bebé. °· Beba leche, jugos de fruta y agua para satisfacer su sed (aproximadamente 10 vasos al día). °· Descanse con frecuencia, relájese y tome sus vitaminas  prenatales para evitar la fatiga, el estrés y la anemia. °· Continúe con los autocontroles de la mama. °· Evite masticar y fumar tabaco. °· Evite el consumo de alcohol y drogas. °Algunos medicamentos, que pueden ser perjudiciales para el bebé, pueden pasar a través de la leche materna. Es importante que consulte a su médico antes de tomar cualquier medicamento, incluidos todos los medicamentos recetados y de venta libre, así como los suplementos vitamínicos y herbales. °Puede quedar embarazada durante la lactancia. Si desea controlar la natalidad, consulte a su médico cuáles son las opciones más seguras para el bebé. °SOLICITE ATENCIÓN MÉDICA SI:  °· Usted siente que quiere dejar de amamantar o se siente frustrada con la lactancia. °· Siente dolor en las mamas o en los pezones. °· Sus pezones están agrietados o sangran. °· Sus pechos están irritados, sensibles o calientes. °· Tiene un área hinchada en cualquiera de las mamas. °· Siente escalofríos o fiebre. °· Tiene náuseas o vómitos. °· Presenta una secreción de otro líquido distinto de la leche materna de los pezones. °· Sus mamas no se llenan antes de amamantar al bebé para el quinto día después del parto. °· Se siente triste y deprimida. °· El bebé está demasiado somnoliento como para comer bien. °· El bebé tiene problemas para dormir. °· Moja menos de 3 pañales en 24 horas. °· Defeca menos de 3 veces en 24 horas. °· La piel del bebé o la parte blanca de los ojos se vuelven amarillentas. °· El bebé no ha aumentado de peso a los 5 días de vida. °SOLICITE ATENCIÓN MÉDICA DE INMEDIATO SI:  °· El bebé está muy cansado (letargo) y no se quiere despertar para comer. °· Le sube la fiebre sin causa. °Document Released: 11/05/2005 Document Revised: 11/10/2013 °ExitCare® Patient Information ©2015 Exit[employer]. This information is not intended to replace advice given to you by your health care provider. Make sure you discuss any questions you have with your health care  provider. ° °

## 2014-09-12 NOTE — Progress Notes (Signed)
Assisted RN  in discharge instructions interpretation.  [name] Mordecai MaesSanchez - [place] Tool WorksSpanish Interpreter

## 2014-09-12 NOTE — Lactation Note (Signed)
This note was copied from the chart of Meagan Moore. Lactation Consultation Note  Patient Name: Meagan Moore [id number]'s Date: 09/12/2014 Reason for consult: Follow-up assessment of baby's feeding and output record. Baby has been exclusively breastfeeding and this is an experienced multipara who breastfed 3 other babies >1 year each.  Baby has had 10-30 minute feedings today with LATCH score=9 per RN assessment.  There is only one stool and one void recorded for this baby and the RN, Fannie KneeSue has reviewed importance of recording feedings and output with help of interpreter tonight.  Baby was heavily meconium-stained at birth which may explain the delay in stooling and mom reported a wet diaper earlier today but it was not recorded by the RN during the day.  RN has arranged for interpreter to return tonight at 12 and 3 am to assist with any information or education needed.  RN to notify LC as needed, as well.   Maternal Data    Feeding Feeding Type: Breast Fed Length of feed: 18 min (with interpreter present when mom reported feed/benita)  LATCH Score/Interventions           [facility] RN assessment           Lactation Tools Discussed/Used   Mom keeping feeding and output record and has been instructed through interpreter, Meagan Moore and RN, Meagan Moore  Consult Status Consult Status: Follow-up Date: 09/13/14 Follow-up type: In-patient    Warrick ParisianBryant, Gaurav Baldree [facility] Centerarmly 09/12/2014, 8:34 PM

## 2014-09-12 NOTE — Progress Notes (Signed)
Assisted RN Fannie KneeSue with interpretation of instructions for baby care  [name] Mordecai MaesSanchez - Spanish Interpreter

## 2014-09-13 ENCOUNTER — Ambulatory Visit: Payer: Self-pay

## 2014-09-13 NOTE — Lactation Note (Addendum)
This note was copied from the chart of Meagan Moore. Lactation Consultation Note  Patient Name: Meagan Moore ZOXWR'UToday's Date: 09/13/2014 Reason for consult: Follow-up assessment LC into to speak to mom for discharge with Meagan Moore ( spanish interpreter )  Mom was trying to latch the baby and LC noted her difficulty, added support pillows for depth and  Baby was able to open wider. LC also noted mom to be boarder line engorged. LC had mom release breast  Down to make areola more compressible with hand expressing and pumping with a hand pump.  #24  flange to tight , switched up to #27 flange and noted it to be a better fit. Able to express 10 ml off, re-latched the baby  With improved depth . Per mom comfortable. Baby fed for 10 mins with multiply swallows, baby released , and LC noted  Baby still to be showing feeding cues . Assisted mom with latching with support on the right breast which had been leaking a large amount . Baby latched with depth with multiply swallows and still feeding after 10 mins.  LC reviewed sore nipple and engorgement prevention and tx. Mom expressed an understanding of tx of engorgement , also dad.  Mother informed of post-discharge support and given phone number to the lactation department, including services for phone call assistance;  out-patient appointments; and breastfeeding support group. List of other breastfeeding resources in the community given in the handout. Encouraged  mother to call for problems or concerns related to breastfeeding.   Maternal Data    Feeding Feeding Type: Breast Fed Length of feed: 15 min  LATCH Score/Interventions Latch: Grasps breast easily, tongue down, lips flanged, rhythmical sucking. Intervention(s): Adjust position;Assist with latch;Breast massage;Breast compression  Audible Swallowing: Spontaneous and intermittent  Type of Nipple: Everted at rest and after stimulation  Comfort (Breast/Nipple):  Filling, red/small blisters or bruises, mild/mod discomfort  Problem noted: Filling  Intervention(s): Breastfeeding basics reviewed;Support Pillows;Position options;Skin to skin     Lactation Tools Discussed/Used     Consult Status Consult Status: Follow-up Date: 09/13/14 Follow-up type: In-patient    Kathrin Greathouseorio, Zoraya Fiorenza Ann 09/13/2014, 1:39 PM

## 2014-09-13 NOTE — Progress Notes (Signed)
Ur chart review completed.  

## 2014-09-14 ENCOUNTER — Inpatient Hospital Stay (HOSPITAL_COMMUNITY): Admission: RE | Admit: 2014-09-14 | Payer: Self-pay | Source: Ambulatory Visit

## 2014-09-16 ENCOUNTER — Inpatient Hospital Stay (HOSPITAL_COMMUNITY): Admission: RE | Admit: 2014-09-16 | Payer: Self-pay | Source: Ambulatory Visit

## 2014-09-16 ENCOUNTER — Ambulatory Visit (HOSPITAL_COMMUNITY): Payer: Self-pay

## 2014-09-20 ENCOUNTER — Encounter (HOSPITAL_COMMUNITY): Payer: Self-pay | Admitting: *Deleted

## 2014-09-21 ENCOUNTER — Encounter (HOSPITAL_COMMUNITY): Payer: Self-pay

## 2014-09-28 ENCOUNTER — Encounter (HOSPITAL_COMMUNITY): Payer: Self-pay | Admitting: *Deleted

## 2014-09-28 ENCOUNTER — Encounter (HOSPITAL_COMMUNITY): Payer: Self-pay

## 2015-03-27 IMAGING — US US OB DETAIL+14 WK
1 of 2 series · 12 of 28 positions shown · non-contrast
Comparison: none

OBSTETRICS REPORT
                      (Signed Final 06/01/2014 [DATE])

             YASUTO
Service(s) Provided
 US OB DETAIL + 14 WK                                  76811.0
Indications
 Detailed fetal anatomic survey
 Advanced maternal age (AMA), Multigravida
Fetal Evaluation
 Num Of Fetuses:    1
 Fetal Heart Rate:  147                          bpm
 Cardiac Activity:  Observed
 Presentation:      Transverse, head to
                    maternal right
 Placenta:          Posterior, above cervical
                    os
 P. Cord            Visualized, central
 Insertion:
 Amniotic Fluid
 AFI FV:      Subjectively within normal limits
                                             Larg Pckt:    6.07  cm
Biometry
 BPD:     65.3  mm     G. Age:  26w 3d                CI:         73.4   70 - 86
                                                      FL/HC:      19.7   18.6 -
 HC:     242.2  mm     G. Age:  26w 2d       30  %    HC/AC:      1.08   1.04 -
 AC:     223.6  mm     G. Age:  26w 5d       61  %    FL/BPD:     73.0   71 - 87
 FL:      47.7  mm     G. Age:  26w 0d       31  %    FL/AC:      21.3   20 - 24
 HUM:     43.1  mm     G. Age:  25w 5d       37  %
 Est. FW:     933  gm      2 lb 1 oz     59  %
Gestational Age
 LMP:           26w 1d        Date:  11/30/13                 EDD:   09/06/14
 U/S Today:     26w 3d                                        EDD:   09/04/14
 Best:          26w 1d     Det. By:  LMP  (11/30/13)          EDD:   09/06/14
Anatomy
 Cranium:          Appears normal         Aortic Arch:      Not well visualized
 Fetal Cavum:      Appears normal         Ductal Arch:      Appears normal
 Ventricles:       Appears normal         Diaphragm:        Appears normal
 Choroid Plexus:   Appears normal         Stomach:          Appears normal, left
                                                            sided
 Cerebellum:       Appears normal         Abdomen:          Appears normal
 Posterior Fossa:  Appears normal         Abdominal Wall:   Not well visualized
 Nuchal Fold:      Not applicable (>20    Cord Vessels:     Appears normal (3
                   wks GA)                                  vessel cord)
 Face:             Appears normal         Kidneys:          Appear normal
                   (orbits and profile)
 Lips:             Appears normal         Bladder:          Appears normal
 Heart:            Appears normal         Spine:            Not well visualized
                   (4CH, axis, and
                   situs)
 RVOT:             Appears normal         Lower             Appears normal
                                          Extremities:
 LVOT:             Appears normal         Upper             Appears normal
 Other:  Fetus appears to be a female. Heels visualized. Technically difficult
         due to maternal habitus and fetal position.
Cervix Uterus Adnexa
 Cervical Length:    3.93     cm
 Cervix:       Not visualized (advanced GA >84wks)
 Uterus:       No abnormality visualized.
 Cul De Sac:   No free fluid seen.
 Left Ovary:    Within normal limits.
 Right Ovary:   Not visualized.
 Adnexa:     No abnormality visualized.
Impression
INDICATION: 34/35 yr old [id number] at 65w4d for fetal anatomic
 survey. Remote read.

[Series 1: us ob comp +14 wk · 81 acquisitions, 12 frames shown]
[im 4/81]
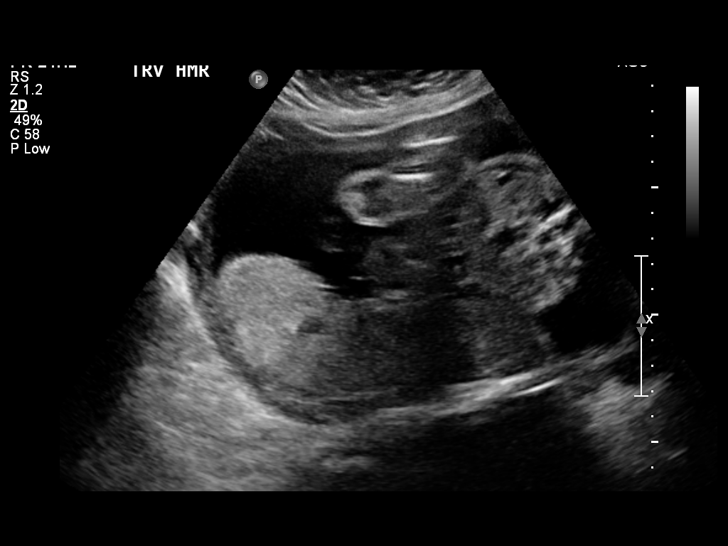
[im 10/81]
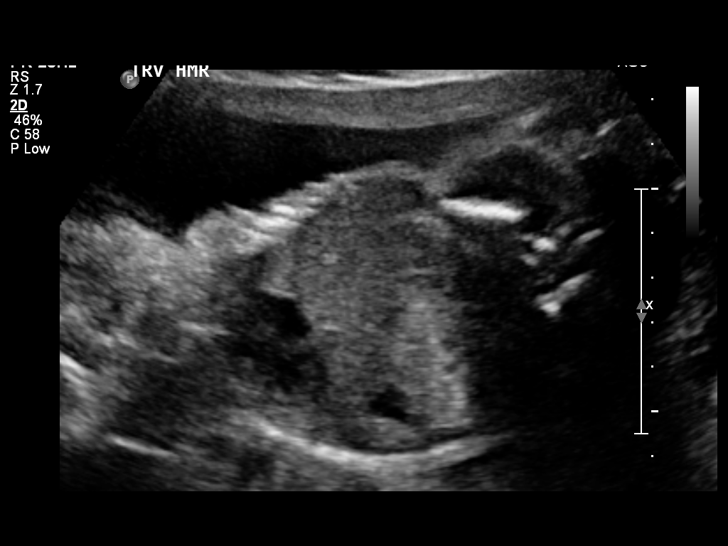
[im 16/81]
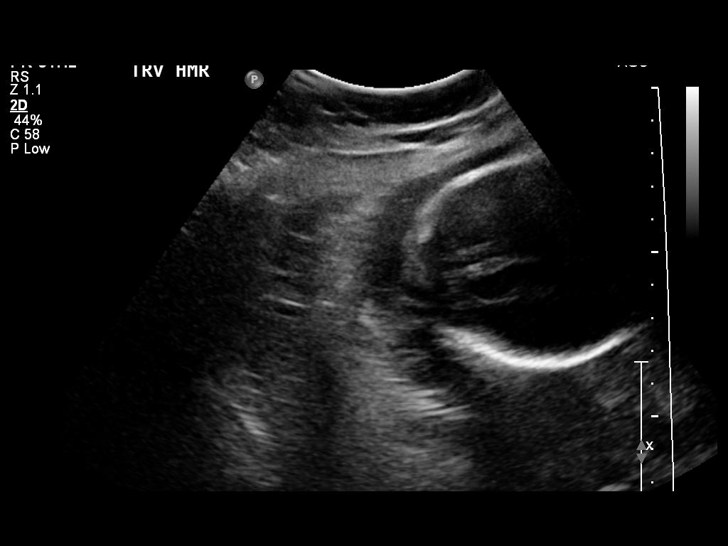
[im 25/81]
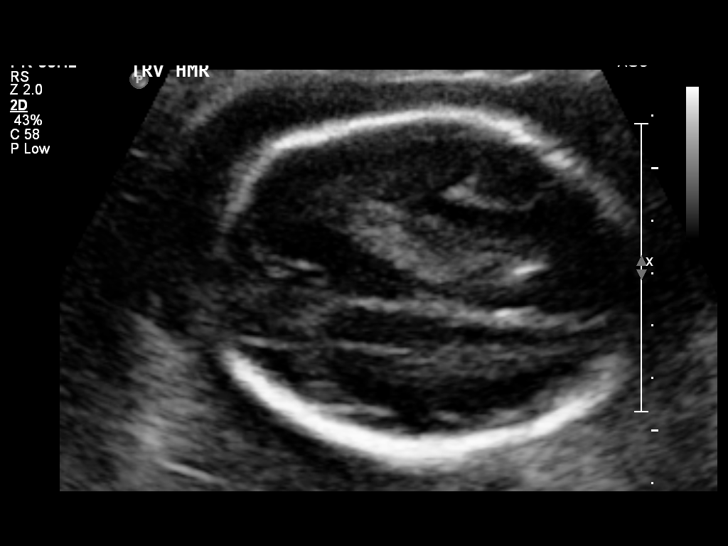
[im 31/81]
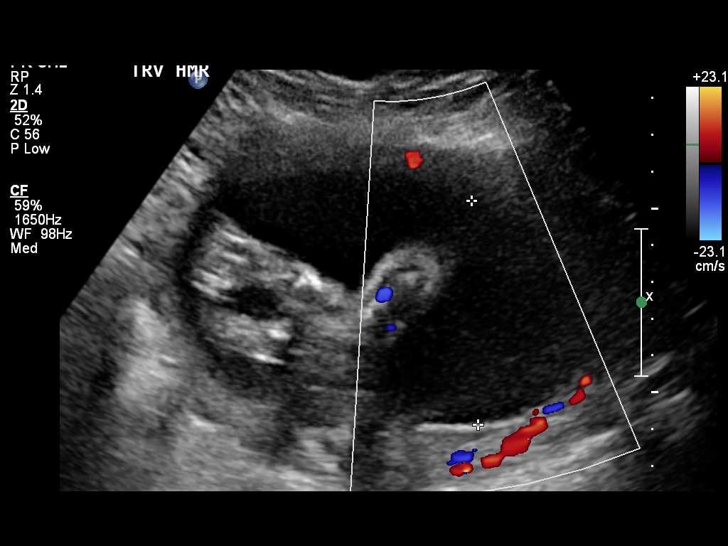
[im 37/81]
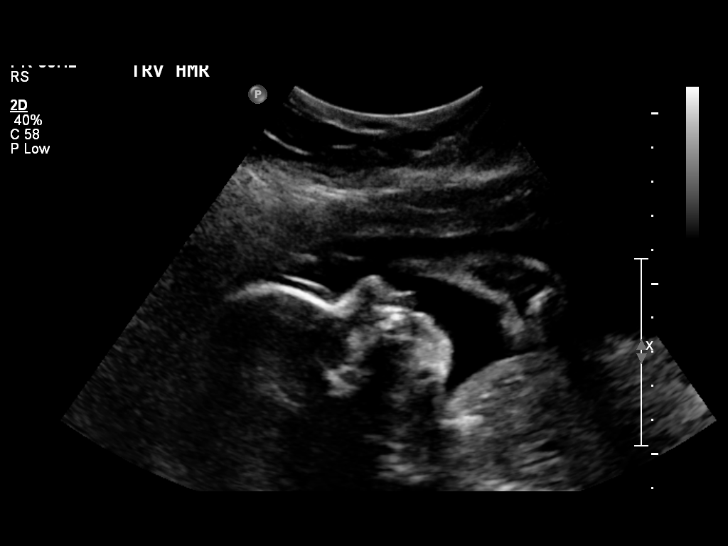
[im 47/81]
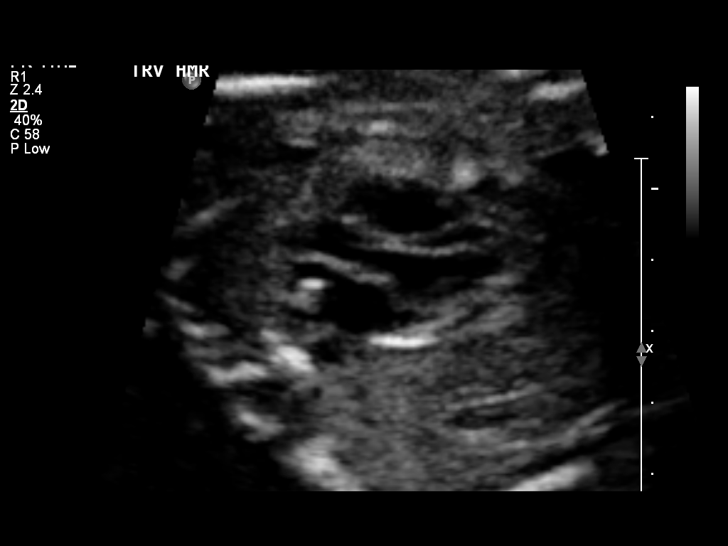
[im 53/81]
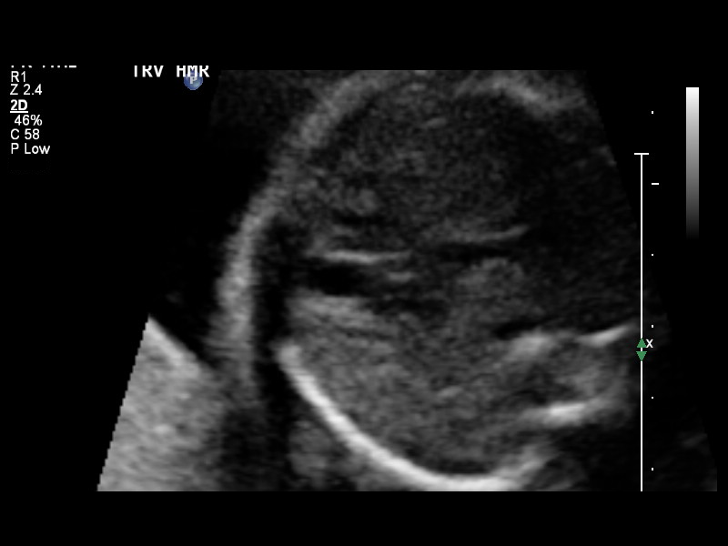
[im 59/81]
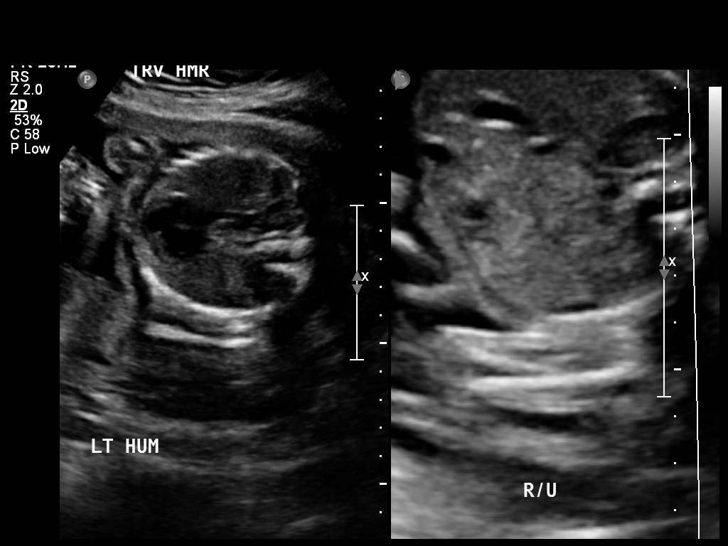
[im 68/81]
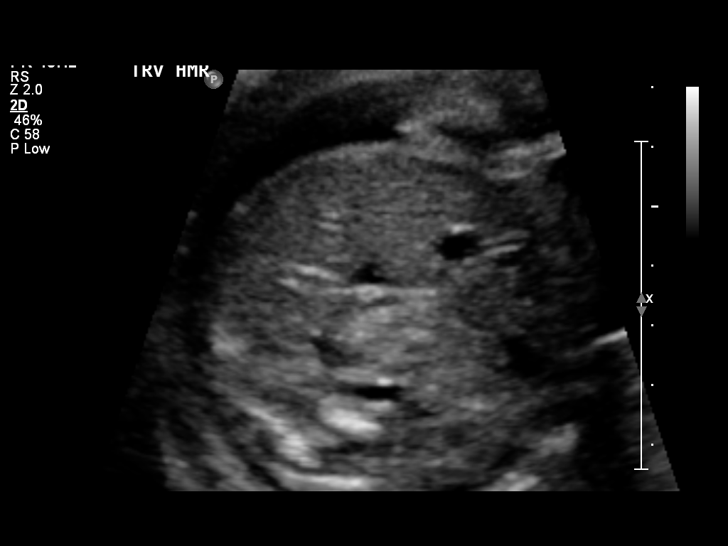
[im 74/81]
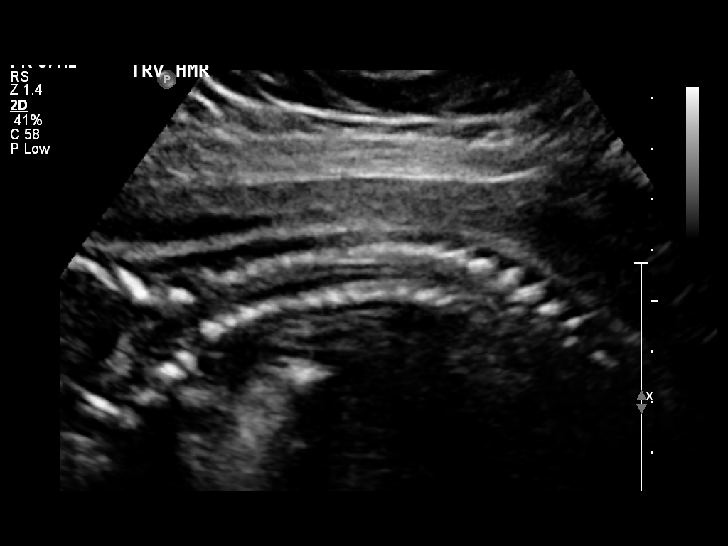
[im 81/81]
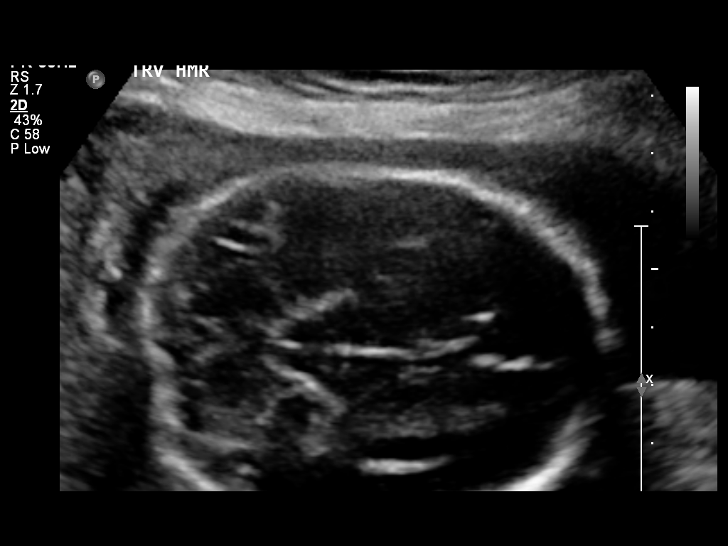

[12 of 28 positions shown; findings below may reference images not displayed]

FINDINGS: 1. Single intrauterine pregnancy.
 2. Estimated fetal weight is in the 59th%.
 3. Posterior placenta without evidence of previa.
 4. Normal amniotic fluid volume.
 5. Normal transabdominal cervical length.
 6. The views of the abdominal cord insertion, aortic arch, and
 spine are limited.
 7. The remainder of the limited anatomy survey is normal.
Recommendations

 1. Appropriate fetal growth.
 2. Limited anatomy survey:
 - recommend follow up in 3-4 weeks to complete anatomic
 survey
 3. Advanced maternal age
 YASUTO with us.  Please do not hesitate to

## 2015-07-12 ENCOUNTER — Encounter (HOSPITAL_COMMUNITY): Payer: Self-pay | Admitting: *Deleted

## 2017-01-03 ENCOUNTER — Emergency Department (HOSPITAL_COMMUNITY)
Admission: EM | Admit: 2017-01-03 | Discharge: 2017-01-03 | Disposition: A | Discharge status: Home / Self Care | Payer: Medicaid Other | Attending: Emergency Medicine | Admitting: Emergency Medicine

## 2017-01-03 ENCOUNTER — Encounter (HOSPITAL_COMMUNITY): Payer: Self-pay | Admitting: Emergency Medicine

## 2017-01-03 DIAGNOSIS — N611 Abscess of the breast and nipple: Secondary | ICD-10-CM | POA: Insufficient documentation

## 2017-01-03 LAB — I-STAT CG4 LACTIC ACID, ED: Lactic Acid, Venous: 1.77 mmol/L (ref 0.5–1.9)

## 2017-01-03 LAB — BASIC METABOLIC PANEL
ANION GAP: 8 (ref 5–15)
BUN: 8 mg/dL (ref 6–20)
CO2: 26 mmol/L (ref 22–32)
Calcium: 9.6 mg/dL (ref 8.9–10.3)
Chloride: 104 mmol/L (ref 101–111)
Creatinine, Ser: 0.61 mg/dL (ref 0.44–1.00)
GFR calc Af Amer: >60 mL/min (ref >60)
GFR calc non Af Amer: >60 mL/min (ref >60)
GLUCOSE: 119 mg/dL — AB (ref 65–99)
POTASSIUM: 4.5 mmol/L (ref 3.5–5.1)
Sodium: 138 mmol/L (ref 135–145)

## 2017-01-03 LAB — CBC
HCT: 34.1 % — ABNORMAL LOW (ref 36.0–46.0)
HEMOGLOBIN: 11 g/dL — AB (ref 12.0–15.0)
MCH: 27.8 pg (ref 26.0–34.0)
MCHC: 32.3 g/dL (ref 30.0–36.0)
MCV: 86.1 fL (ref 78.0–100.0)
Platelets: 319 10*3/uL (ref 150–400)
RBC: 3.96 MIL/uL (ref 3.87–5.11)
RDW: 12.4 % (ref 11.5–15.5)
WBC: 10.8 10*3/uL — ABNORMAL HIGH (ref 4.0–10.5)

## 2017-01-03 MED ORDER — CEPHALEXIN 500 MG PO CAPS: 500.0000 mg | ORAL_CAPSULE | Freq: Four times a day (QID) | ORAL | 0 refills | Status: DC

## 2017-01-03 NOTE — Discharge Instructions (Signed)
Por favor, tomar los antibiticos 4 veces por da durante [address]. Por favor vaya a [address] [place], [place] [address] y dgales que usted tiene un absceso mamario y la necesidad de tener imgenes. Hacer tomar Tylenol y motrin para [employer] dolor. Por favor, aplique compresas calientes. Nmero de telfono de la oficina es [phone]   Please take the antibiotics 4 times per day for 10 days. Please go to [address] [place], [place] [address] and tell them you have a breast abscess and need to have imaging. You make take Tylenol and motrin for pain. Please apply warm compresses.

## 2017-01-03 NOTE — ED Triage Notes (Signed)
Left breast abcess x 3 days , pt states is breast feeding , area is red , tender and quarter size

## 2017-01-03 NOTE — ED Provider Notes (Signed)
[facility]-EMERGENCY DEPT Provider Note   CSN: [id number] Arrival date & time: 01/03/17  1145     History   Chief Complaint Chief Complaint  Patient presents with  . Wound Infection    HPI Meagan Moore is a 36 y.o. female.  36 yo hispanic female with no sign pmh presents to the ED with breast abscess. Pt has been breast feeding her 2 1/2 year child. She noticed that her left breast become red and had a spot develop on her left breast 2 days ago and has worsened. She has not tried anything at home for the pain. Nothing makes better or worse. Pt states that her child feeds from the right breast. She has never latched onto her left breast. She has tried to use a breast pump on her left breast but has never gotten much mild return from it. She states she only gets a couple of drops. She denies any fever, chills, n/v/d, drainage from the site, hx or MRSA or any other associated symptoms.       Past Medical History:  Diagnosis Date  . Medical history non-contributory     Patient Active Problem List   Diagnosis Date Noted  . Premature rupture of membranes 09/10/2014    Past Surgical History:  Procedure Laterality Date  . NO PAST SURGERIES      OB History    Gravida Para Term Preterm AB Living   8 7 7  0 0 1   SAB TAB Ectopic Multiple Live Births   0 0 0   1       Home Medications    Prior to Admission medications   Medication Sig Start Date End Date Taking? Authorizing Provider  ibuprofen (ADVIL,MOTRIN) 600 MG tablet Take 1 tablet (600 mg total) by mouth every 6 (six) hours as needed for mild pain, moderate pain or cramping. 09/12/14   Cheral MarkerKimberly R Booker, CNM  loratadine (CLARITIN) 10 MG tablet Take 10 mg by mouth daily.    Historical Provider, MD  loratadine (CLARITIN) 10 MG tablet Take 10 mg by mouth daily.    Historical Provider, MD  Prenatal Vit-Fe Fumarate-FA (PRENATAL MULTIVITAMIN) TABS tablet Take 1 tablet by mouth daily at 12 noon.    Historical Provider,  MD  Prenatal Vit-Fe Fumarate-FA (PRENATAL MULTIVITAMIN) TABS tablet Take 1 tablet by mouth daily at 12 noon.    Historical Provider, MD    Family History Family History  Problem Relation Age of Onset  . Alcohol abuse Neg Hx   . Arthritis Neg Hx   . Asthma Neg Hx   . Birth defects Neg Hx   . Cancer Neg Hx   . COPD Neg Hx   . Depression Neg Hx   . Diabetes Neg Hx   . Drug abuse Neg Hx   . Early death Neg Hx   . Hearing loss Neg Hx   . Heart disease Neg Hx   . Hyperlipidemia Neg Hx   . Hypertension Neg Hx   . Kidney disease Neg Hx   . Learning disabilities Neg Hx   . Mental illness Neg Hx   . Mental retardation Neg Hx   . Miscarriages / Stillbirths Neg Hx   . Stroke Neg Hx   . Vision loss Neg Hx   . Varicose Veins Neg Hx     Social History Social History  Substance Use Topics  . Smoking status: Never Smoker  . Smokeless tobacco: Never Used  . Alcohol use No  Allergies   Patient has no known allergies.   Review of Systems Review of Systems  Constitutional: Negative for chills and fever.  Gastrointestinal: Negative for diarrhea, nausea and vomiting.  Skin: Positive for wound.  Neurological: Negative for syncope, weakness and headaches.  All other systems reviewed and are negative.    Physical Exam Updated Vital Signs BP 114/70   Pulse 79   Temp 98.1 F (36.7 C) (Oral)   Resp 16   SpO2 98%   Physical Exam  Constitutional: She appears well-developed and well-nourished. No distress.  Non toxic appearing.  HENT:  Head: Normocephalic and atraumatic.  Mouth/Throat: Oropharynx is clear and moist.  Eyes: Conjunctivae are normal. Right eye exhibits no discharge. Left eye exhibits no discharge. No scleral icterus.  Neck: Normal range of motion. Neck supple. No thyromegaly present.  Cardiovascular: Normal rate, regular rhythm, normal heart sounds and intact distal pulses.   Musculoskeletal: Normal range of motion.  Lymphadenopathy:    She has no cervical  adenopathy.  Neurological: She is alert.  Skin: Skin is warm and dry.  2x2 cm abscess noted to the left breast. Area of fluctuance. Surrounding area of induration. Area is warm to touch. No discharge. TTP.  Nipple without any signs of infection. No discharge from nipple/  Nursing note and vitals reviewed.      ED Treatments / Results  Labs (all labs ordered are listed, but only abnormal results are displayed) Labs Reviewed  BASIC METABOLIC PANEL - Abnormal; Notable for the following:       Result Value   Glucose, Bld 119 (*)    All other components within normal limits  CBC - Abnormal; Notable for the following:    WBC 10.8 (*)    Hemoglobin 11.0 (*)    HCT 34.1 (*)    All other components within normal limits  I-STAT CG4 LACTIC ACID, ED    EKG  EKG Interpretation None       Radiology No results found.  Procedures Procedures (including critical care time)  Medications Ordered in ED Medications - No data to display   Initial Impression / Assessment and Plan / ED Course  I have reviewed the triage vital signs and the nursing notes.  Pertinent labs & imaging results that were available during my care of the patient were reviewed by me and considered in my medical decision making (see chart for details).     Pt presents to the ED with left breast abscess. Pt breastfeeds but not from the affected breast. She has never been able to express much mild from her left breast. Exam concerning for mastitis and abscess. Do not feel comfortable draining area. Will treat with antibiotics. Pt is non toxic appearing. She is afebrile and not tachycardic. Lactic acid normal. No leukocytosis. Encourage warm compresses. Tylenol and motrin for pain. Start antibiotics. I call over to the breast center to get her an appointment tomorrow for a breast ultrasound and possible IandD. Breast centers states they do not see breast abscess pt. Spoke with Dr. Criss Alvine about pt who spoke with IR who  suggest sending pt to family medicine location tomorrow who will see pt and perform ultrasound tomorrow. Given pt information and address. Dicussed with pt to need to start abx and symptomatic treatment. Dr. Rayford Halsted agrees with the above plan. Pt is hemodynamically stable, in NAD, & able to ambulate in the ED. Pain has been managed & has no complaints prior to dc. Pt is comfortable with above plan  and is stable for discharge at this time. All questions were answered prior to disposition. Strict return precautions for f/u to the ED were discussed.   Final Clinical Impressions(s) / ED Diagnoses   Final diagnoses:  Breast abscess    New Prescriptions Discharge Medication List as of 01/03/2017  4:14 PM    START taking these medications   Details  cephALEXin (KEFLEX) 500 MG capsule Take 1 capsule (500 mg total) by mouth 4 (four) times daily., Starting Thu 01/03/2017, Print         Rise Mu, PA-C 01/03/17 2250    Pricilla Loveless, MD 01/04/17 [phone]

## 2017-09-19 ENCOUNTER — Other Ambulatory Visit (HOSPITAL_COMMUNITY): Payer: Self-pay | Admitting: Nurse Practitioner

## 2017-09-19 DIAGNOSIS — Z3A13 13 weeks gestation of pregnancy: Secondary | ICD-10-CM

## 2017-09-19 DIAGNOSIS — Z369 Encounter for antenatal screening, unspecified: Secondary | ICD-10-CM

## 2017-09-19 LAB — OB RESULTS CONSOLE HIV ANTIBODY (ROUTINE TESTING)
HIV: NONREACTIVE
HIV: NONREACTIVE

## 2017-09-19 LAB — OB RESULTS CONSOLE GC/CHLAMYDIA
Chlamydia: NEGATIVE
Chlamydia: NEGATIVE
GC PROBE AMP, GENITAL: NEGATIVE
Gonorrhea: NEGATIVE

## 2017-09-19 LAB — OB RESULTS CONSOLE RUBELLA ANTIBODY, IGM
Rubella: IMMUNE
Rubella: IMMUNE

## 2017-09-19 LAB — OB RESULTS CONSOLE HEPATITIS B SURFACE ANTIGEN
HEP B S AG: NEGATIVE
Hepatitis B Surface Ag: NEGATIVE

## 2017-09-25 ENCOUNTER — Other Ambulatory Visit (HOSPITAL_COMMUNITY): Payer: Self-pay | Admitting: Nurse Practitioner

## 2017-09-25 ENCOUNTER — Ambulatory Visit (HOSPITAL_COMMUNITY): Admission: RE | Admit: 2017-09-25 | Payer: Self-pay | Source: Ambulatory Visit

## 2017-09-25 ENCOUNTER — Ambulatory Visit (HOSPITAL_COMMUNITY)
Admission: RE | Admit: 2017-09-25 | Discharge: 2017-09-25 | Disposition: A | Discharge status: Home / Self Care | Payer: Self-pay | Source: Ambulatory Visit | Attending: Nurse Practitioner | Admitting: Nurse Practitioner

## 2017-09-25 ENCOUNTER — Encounter (HOSPITAL_COMMUNITY): Payer: Self-pay | Admitting: *Deleted

## 2017-09-25 DIAGNOSIS — O09521 Supervision of elderly multigravida, first trimester: Secondary | ICD-10-CM

## 2017-09-25 DIAGNOSIS — Z369 Encounter for antenatal screening, unspecified: Secondary | ICD-10-CM

## 2017-09-25 DIAGNOSIS — Z3A13 13 weeks gestation of pregnancy: Secondary | ICD-10-CM | POA: Insufficient documentation

## 2017-09-25 DIAGNOSIS — O09522 Supervision of elderly multigravida, second trimester: Secondary | ICD-10-CM | POA: Insufficient documentation

## 2017-09-25 DIAGNOSIS — Z3682 Encounter for antenatal screening for nuchal translucency: Secondary | ICD-10-CM | POA: Insufficient documentation

## 2017-11-19 NOTE — L&D Delivery Note (Signed)
Delivery Note At 9:49 PM a viable female was delivered via Vaginal, Spontaneous (Presentation: ROA).  APGAR: 9, 9; weight pending 1hr skin to skin.   Placenta status: Intact, marginal cord insertion .  Cord: 3V  with the following complications: None.  Cord pH: N/A  Anesthesia: None   Episiotomy: None Lacerations: None Suture Repair: None Est. Blood Loss (mL): 150  Mom to postpartum.  Baby to Couplet care / Skin to Skin.  Caryl Ada, DO 03/30/2018, 10:38 PM

## 2018-03-11 LAB — OB RESULTS CONSOLE GBS: GBS: NEGATIVE

## 2018-03-30 ENCOUNTER — Inpatient Hospital Stay (HOSPITAL_COMMUNITY)
Admission: AD | Admit: 2018-03-30 | Discharge: 2018-03-30 | Disposition: A | Discharge status: Home / Self Care | Payer: Medicaid Other | Source: Ambulatory Visit | Attending: Family Medicine | Admitting: Family Medicine

## 2018-03-30 ENCOUNTER — Inpatient Hospital Stay (HOSPITAL_COMMUNITY)
Admission: AD | Admit: 2018-03-30 | Discharge: 2018-04-01 | DRG: 807 | Disposition: A | Discharge status: Home / Self Care | Payer: Medicaid Other | Source: Ambulatory Visit | Attending: Obstetrics & Gynecology | Admitting: Obstetrics & Gynecology

## 2018-03-30 ENCOUNTER — Encounter (HOSPITAL_COMMUNITY): Payer: Self-pay | Admitting: *Deleted

## 2018-03-30 DIAGNOSIS — Z3A00 Weeks of gestation of pregnancy not specified: Secondary | ICD-10-CM

## 2018-03-30 DIAGNOSIS — Z3483 Encounter for supervision of other normal pregnancy, third trimester: Secondary | ICD-10-CM | POA: Diagnosis present

## 2018-03-30 DIAGNOSIS — O479 False labor, unspecified: Secondary | ICD-10-CM

## 2018-03-30 DIAGNOSIS — Z3A40 40 weeks gestation of pregnancy: Secondary | ICD-10-CM

## 2018-03-30 DIAGNOSIS — O43123 Velamentous insertion of umbilical cord, third trimester: Principal | ICD-10-CM | POA: Diagnosis present

## 2018-03-30 LAB — CBC
HEMATOCRIT: 38.2 % (ref 36.0–46.0)
HEMOGLOBIN: 12.1 g/dL (ref 12.0–15.0)
MCH: 29.1 pg (ref 26.0–34.0)
MCHC: 31.7 g/dL (ref 30.0–36.0)
MCV: 91.8 fL (ref 78.0–100.0)
Platelets: 191 10*3/uL (ref 150–400)
RBC: 4.16 MIL/uL (ref 3.87–5.11)
RDW: 15.2 % (ref 11.5–15.5)
WBC: 9 10*3/uL (ref 4.0–10.5)

## 2018-03-30 LAB — TYPE AND SCREEN
ABO/RH(D): O POS
ANTIBODY SCREEN: NEGATIVE

## 2018-03-30 MED ORDER — PRENATAL MULTIVITAMIN CH
1.0000 | ORAL_TABLET | Freq: Every day | ORAL | Status: DC
  Administered 2018-03-31: 1 via ORAL
  Filled 2018-03-30: qty 1

## 2018-03-30 MED ORDER — DIPHENHYDRAMINE HCL 25 MG PO CAPS: 25.0000 mg | ORAL_CAPSULE | Freq: Four times a day (QID) | ORAL | Status: DC | PRN

## 2018-03-30 MED ORDER — IBUPROFEN 600 MG PO TABS
600.0000 mg | ORAL_TABLET | Freq: Four times a day (QID) | ORAL | Status: DC
  Administered 2018-03-31 – 2018-04-01 (×6): 600 mg via ORAL
  Filled 2018-03-30 (×6): qty 1

## 2018-03-30 MED ORDER — OXYTOCIN 40 UNITS IN LACTATED RINGERS INFUSION - SIMPLE MED
INTRAVENOUS | Status: AC
  Filled 2018-03-30: qty 1000

## 2018-03-30 MED ORDER — ACETAMINOPHEN 325 MG PO TABS: 650.0000 mg | ORAL_TABLET | ORAL | Status: DC | PRN

## 2018-03-30 MED ORDER — MISOPROSTOL 200 MCG PO TABS
ORAL_TABLET | ORAL | Status: AC
  Filled 2018-03-30: qty 4

## 2018-03-30 MED ORDER — TERBUTALINE SULFATE 1 MG/ML IJ SOLN
0.2500 mg | Freq: Once | INTRAMUSCULAR | Status: DC | PRN
  Filled 2018-03-30: qty 1

## 2018-03-30 MED ORDER — OXYTOCIN 40 UNITS IN LACTATED RINGERS INFUSION - SIMPLE MED: 2.5000 [IU]/h | INTRAVENOUS | Status: DC

## 2018-03-30 MED ORDER — LIDOCAINE HCL (PF) 1 % IJ SOLN
INTRAMUSCULAR | Status: AC
  Filled 2018-03-30: qty 30

## 2018-03-30 MED ORDER — FENTANYL CITRATE (PF) 100 MCG/2ML IJ SOLN: 100.0000 ug | INTRAMUSCULAR | Status: DC | PRN

## 2018-03-30 MED ORDER — SIMETHICONE 80 MG PO CHEW: 80.0000 mg | CHEWABLE_TABLET | ORAL | Status: DC | PRN

## 2018-03-30 MED ORDER — ZOLPIDEM TARTRATE 5 MG PO TABS: 5.0000 mg | ORAL_TABLET | Freq: Every evening | ORAL | Status: DC | PRN

## 2018-03-30 MED ORDER — SENNOSIDES-DOCUSATE SODIUM 8.6-50 MG PO TABS
2.0000 | ORAL_TABLET | ORAL | Status: DC
  Administered 2018-03-31 (×2): 2 via ORAL
  Filled 2018-03-30 (×2): qty 2

## 2018-03-30 MED ORDER — WITCH HAZEL-GLYCERIN EX PADS: 1.0000 "application " | MEDICATED_PAD | CUTANEOUS | Status: DC | PRN

## 2018-03-30 MED ORDER — OXYTOCIN BOLUS FROM INFUSION: 500.0000 mL | Freq: Once | INTRAVENOUS | Status: DC

## 2018-03-30 MED ORDER — LIDOCAINE HCL (PF) 1 % IJ SOLN
30.0000 mL | INTRAMUSCULAR | Status: DC | PRN
  Filled 2018-03-30: qty 30

## 2018-03-30 MED ORDER — LACTATED RINGERS IV SOLN: INTRAVENOUS | Status: DC

## 2018-03-30 MED ORDER — BENZOCAINE-MENTHOL 20-0.5 % EX AERO: 1.0000 "application " | INHALATION_SPRAY | CUTANEOUS | Status: DC | PRN

## 2018-03-30 MED ORDER — OXYCODONE HCL 5 MG PO TABS: 5.0000 mg | ORAL_TABLET | ORAL | Status: DC | PRN

## 2018-03-30 MED ORDER — COCONUT OIL OIL
1.0000 | TOPICAL_OIL | Status: DC | PRN
Start: 2018-03-30 — End: 2018-04-01

## 2018-03-30 MED ORDER — FLEET ENEMA 7-19 GM/118ML RE ENEM: 1.0000 | ENEMA | RECTAL | Status: DC | PRN

## 2018-03-30 MED ORDER — OXYCODONE-ACETAMINOPHEN 5-325 MG PO TABS: 1.0000 | ORAL_TABLET | ORAL | Status: DC | PRN

## 2018-03-30 MED ORDER — LACTATED RINGERS IV SOLN: 500.0000 mL | INTRAVENOUS | Status: DC | PRN

## 2018-03-30 MED ORDER — OXYCODONE-ACETAMINOPHEN 5-325 MG PO TABS: 2.0000 | ORAL_TABLET | ORAL | Status: DC | PRN

## 2018-03-30 MED ORDER — ONDANSETRON HCL 4 MG PO TABS
4.0000 mg | ORAL_TABLET | ORAL | Status: DC | PRN
Start: 2018-03-30 — End: 2018-04-01

## 2018-03-30 MED ORDER — DIBUCAINE 1 % RE OINT: 1.0000 "application " | TOPICAL_OINTMENT | RECTAL | Status: DC | PRN

## 2018-03-30 MED ORDER — ONDANSETRON HCL 4 MG/2ML IJ SOLN: 4.0000 mg | INTRAMUSCULAR | Status: DC | PRN

## 2018-03-30 MED ORDER — TETANUS-DIPHTH-ACELL PERTUSSIS 5-2.5-18.5 LF-MCG/0.5 IM SUSP: 0.5000 mL | Freq: Once | INTRAMUSCULAR | Status: DC

## 2018-03-30 MED ORDER — ONDANSETRON HCL 4 MG/2ML IJ SOLN: 4.0000 mg | Freq: Four times a day (QID) | INTRAMUSCULAR | Status: DC | PRN

## 2018-03-30 MED ORDER — SOD CITRATE-CITRIC ACID 500-334 MG/5ML PO SOLN: 30.0000 mL | ORAL | Status: DC | PRN

## 2018-03-30 NOTE — H&P (Addendum)
Obstetric History and Physical  Meagan Moore is a 37 y.o. [id number] with IUP at [redacted]w[redacted]d presenting for SOL. Patient states she has been having  regular, every contractions for the alst 3 hours, minimal vaginal bleeding, ?ruptured membranes, with active fetal movement.  No blurry vision, headaches or peripheral edema, and RUQ pain.   Prenatal Course Source of Care: HD Dating: By LMP --->  Estimated Date of Delivery: 03/29/18 Pregnancy complications or risks: Patient Active Problem List   Diagnosis Date Noted  . Normal labor 03/30/2018  . Premature rupture of membranes 09/10/2014   She plans to breastfeed She desires no method for postpartum contraception.    Prenatal labs and studies: ABO, Rh:  O Pos Antibody:  NEGATIVE Rubella:  IMMUNE RPR:   NEGATIVE HBsAg:   NEGATIVE HIV:  NON-REACTIVE GBS: NEGATIVE 1 hr Glucola  124 - normal Genetic screening normal Anatomy US normal  Prenatal Transfer Tool  Maternal Diabetes: No Genetic Screening: Normal Maternal Ultrasounds/Referrals: Normal Fetal Ultrasounds or other Referrals:  None Maternal Substance Abuse:  No Significant Maternal Medications:  None Significant Maternal Lab Results: None  Past Medical History:  Diagnosis Date  . Medical history non-contributory     Past Surgical History:  Procedure Laterality Date  . NO PAST SURGERIES      OB History  Gravida Para Term Preterm AB Living  0 0 4  SAB TAB Ectopic Multiple Live Births  0 0 0   1    # Outcome Date GA Lbr Len/2nd Weight Sex Delivery Anes PTL Lv  5 Current           4 Term 09/10/14 [redacted]w[redacted]d 35:36 2.995 kg (6 lb 9.6 oz) F Vag-Spont None  LIV  3 Term           2 Term           1 Term             Social History   Socioeconomic History  . Marital status: Married    Spouse name: Not on file  . Number of children: Not on file  . Years of education: Not on file  . Highest education level: Not on file  Occupational History  . Not on file   Social Needs  . Financial resource strain: Not on file  . Food insecurity:    Worry: Not on file    Inability: Not on file  . Transportation needs:    Medical: Not on file    Non-medical: Not on file  Tobacco Use  . Smoking status: Never Smoker  . Smokeless tobacco: Never Used  Substance and Sexual Activity  . Alcohol use: No  . Drug use: No  . Sexual activity: Yes    Birth control/protection: None  Lifestyle  . Physical activity:    Days per week: Not on file    Minutes per session: Not on file  . Stress: Not on file  Relationships  . Social connections:    Talks on phone: Not on file    Gets together: Not on file    Attends religious service: Not on file    Active member of club or organization: Not on file    Attends meetings of clubs or organizations: Not on file    Relationship status: Not on file  Other Topics Concern  . Not on file  Social History Narrative   ** Merged History Encounter **        Family History  Problem Relation Age of Onset  . Alcohol abuse Neg Hx   . Arthritis Neg Hx   . Asthma Neg Hx   . Birth defects Neg Hx   . Cancer Neg Hx   . COPD Neg Hx   . Depression Neg Hx   . Diabetes Neg Hx   . Drug abuse Neg Hx   . Early death Neg Hx   . Hearing loss Neg Hx   . Heart disease Neg Hx   . Hyperlipidemia Neg Hx   . Hypertension Neg Hx   . Kidney disease Neg Hx   . Learning disabilities Neg Hx   . Mental illness Neg Hx   . Mental retardation Neg Hx   . Miscarriages / Stillbirths Neg Hx   . Stroke Neg Hx   . Vision loss Neg Hx   . Varicose Veins Neg Hx     Medications Prior to Admission  Medication Sig Dispense Refill Last Dose  . cephALEXin (KEFLEX) 500 MG capsule Take 1 capsule (500 mg total) by mouth 4 (four) times daily. (Patient not taking: Reported on 09/25/2017) 40 capsule 0 Not Taking  . ibuprofen (ADVIL,MOTRIN) 600 MG tablet Take 1 tablet (600 mg total) by mouth every 6 (six) hours as needed for mild pain, moderate pain or  cramping. (Patient not taking: Reported on 01/03/2017) 30 tablet 0 Not Taking  . Multiple Vitamin (MULTIVITAMIN WITH MINERALS) TABS tablet Take 1 tablet by mouth daily.   03/29/2018 at Unknown time  . Prenatal Vit-Fe Fumarate-FA (PRENATAL VITAMIN PO) Take by mouth.   03/29/2018 at Unknown time    No Known Allergies  Review of Systems: Negative except for what is mentioned in HPI.  Physical Exam: Temp 98.1 F (36.7 C) (Oral)   Ht  (1.727 m)   Wt 97.5 kg (215 lb 0.6 oz)   LMP 06/22/2017   BMI 32.70 kg/m  CONSTITUTIONAL: Well-developed, well-nourished female in no acute distress.  HENT:  Normocephalic, atraumatic, External right and left ear normal. Oropharynx is clear and moist EYES: Conjunctivae and EOM are normal. Pupils are equal, round, and reactive to light. No scleral icterus.  NECK: Normal range of motion, supple, no masses SKIN: Skin is warm and dry. No rash noted. Not diaphoretic. No erythema. No pallor. NEUROLOGIC: Alert and oriented to person, place, and time. Normal reflexes, muscle tone coordination. No cranial nerve deficit noted. PSYCHIATRIC: Normal mood and affect. Normal behavior. Normal judgment and thought content. CARDIOVASCULAR: Normal heart rate noted, regular rhythm RESPIRATORY: Effort and breath sounds normal, no problems with respiration noted ABDOMEN: Soft, nontender, nondistended, gravid. MUSCULOSKELETAL: Normal range of motion. No edema and no tenderness. 2+ distal pulses.  Cervical Exam: Dilation: 8 Effacement (%): 90 Cervical Position: Middle Station: 0 Presentation: Vertex Exam by:: Doroteo Glassman MD  FHT:  Baseline rate 145 bpm   Variability moderate  Accelerations present   Decelerations none Contractions: Every 2-3 mins   Pertinent Labs/Studies:   No results found for this or any previous visit (from the past 24 hour(s)).  Assessment : Meagan Moore is a 37 y.o. [id number] at [redacted]w[redacted]d being admitted for labor.  Plan: Labor: Expectant  management.  Analgesia as needed. FWB: Reassuring fetal heart tracing.  GBS negative Delivery plan: Hopeful for vaginal delivery   Caryl Ada, DO OB [facility], [facility] [place] 03/30/2018, 8:41 PM

## 2018-03-30 NOTE — Discharge Instructions (Signed)
Evaluacin de los movimientos fetales  Fetal Movement Counts  Introduccin  Nombre del paciente: ________________________________________________ Fecha de parto estimada: ____________________  Qu es una evaluacin de los movimientos fetales?  Una evaluacin de los movimientos fetales es el registro del nmero de veces que siente que el beb se mueve durante un cierto perodo de tiempo. Esto tambin se puede denominar recuento de patadas fetales. Una evaluacin de movimientos fetales se recomienda a todas las embarazadas. Es posible que le indiquen que comience a evaluar los movimientos fetales desde la semana 28 de embarazo.  Preste atencin cuando sienta que el beb est ms activo. Podr detectar los ciclos en que el beb duerme y est despierto. Tambin podr detectar que ciertas cosas hacen que su beb se mueva ms. Deber realizar una evaluacin de los movimientos fetales en las siguientes situaciones:   Cuando el beb est ms activo habitualmente.   A la misma hora, todos los das.    Un buen momento para evaluar los movimientos fetales es cuando est descansando, despus de haber comido y bebido algo.  Cmo debo contar los movimientos fetales?  1. Encuentre un lugar tranquilo y cmodo. Sintese o acustese de lado.  2. Anote la fecha, la hora de inicio y de finalizacin y la cantidad de movimientos que sinti entre esas dos horas. Lleve esta informacin a las visitas de control.  3. Cuente las pataditas, revoloteos, chasquidos, vueltas o pinchazos en un perodo de 2horas. Debe sentir al menos 10movimientos en 2horas.  4. Cuando sienta 10movimientos, puede dejar de contar.  5. Si no siente 10movimientos en 2horas, coma y beba algo. Luego, contine descansando y contando durante 1hora. Si siente al menos 4movimientos durante esa hora, puede dejar de contar.  Comunquese con un mdico si:   Siente menos de 4movimientos en 2horas.   El beb no se mueve tanto como suele hacerlo.  Fecha:  ____________ Hora de inicio: ____________ Hora de finalizacin: ____________ Movimientos: ____________  Fecha: ____________ Hora de inicio: ____________ Hora de finalizacin: ____________ Movimientos: ____________  Fecha: ____________ Hora de inicio: ____________ Hora de finalizacin: ____________ Movimientos: ____________  Fecha: ____________ Hora de inicio: ____________ Hora de finalizacin: ____________ Movimientos: ____________  Fecha: ____________ Hora de inicio: ____________ Hora de finalizacin: ____________ Movimientos: ____________  Fecha: ____________ Hora de inicio: ____________ Hora de finalizacin: ____________ Movimientos: ____________  Fecha: ____________ Hora de inicio: ____________ Hora de finalizacin: ____________ Movimientos: ____________  Fecha: ____________ Hora de inicio: ____________ Hora de finalizacin: ____________ Movimientos: ____________  Fecha: ____________ Hora de inicio: ____________ Hora de finalizacin: ____________ Movimientos: ____________  Esta informacin no tiene como fin reemplazar el consejo del mdico. Asegrese de hacerle al mdico cualquier pregunta que tenga.  Document Released: 02/12/2008 Document Revised: 02/08/2017 Document Reviewed: 12/15/2015  Elsevier Interactive Patient Education  2018 [employer]

## 2018-03-30 NOTE — MAU Note (Signed)
Pt presents to MAU c/o ctx every . Pt denies LOF and reports +FM. Pt reports some bloody mucous. No other complaints.

## 2018-03-31 LAB — RPR: RPR Ser Ql: NONREACTIVE

## 2018-03-31 NOTE — Progress Notes (Signed)
Post Partum Day 1 Subjective: no complaints, up ad lib, voiding and tolerating PO  Objective: Blood pressure 107/62, pulse 72, temperature 98.3 F (36.8 C), temperature source Oral, resp. rate 18, height  (1.727 m), weight 97.5 kg (215 lb 0.6 oz), last menstrual period 06/22/2017, unknown if currently breastfeeding.  Physical Exam:  General: alert, cooperative and no distress Lochia: appropriate Uterine Fundus: firm Incision: N/A DVT Evaluation: No evidence of DVT seen on physical exam. No cords or calf tenderness. No significant calf/ankle edema.  Recent Labs    03/30/18 2032  HGB 12.1  HCT 38.2    Assessment/Plan: Plan for discharge tomorrow, Breastfeeding and Lactation consult   LOS: 1 day   Caryl Ada, DO 03/31/2018, 8:49 AM

## 2018-03-31 NOTE — Lactation Note (Signed)
This note was copied from a baby's chart. Lactation Consultation Note  Patient Name: Meagan Moore WUJWJ'X Date: 03/31/2018 Reason for consult: Initial assessment;Term  P5 Spanish speaking mother whose infant is now 33 hours old. RN requested LC assistance.  This mother has breastfed all of her children, ranging in length from 1 1/2-almost 3 years.  Interpreter [phone] used for this initial visit.  Mother had just finished attempting to latch as I entered.  She willingly accepted my assistance.  Mother's breasts are soft and non tender and nipples are erect without trauma.  I attempted to latch infant to right breast in the football hold.  He opened his mouth wide and latched but did not suck.  He was very sleepy and I reassured mother this was normal.  Mother stated he was interested and did latch after delivery.  Reviewed STS, feeding cues, feeding 8-12 times/24 hours or earlier if he shows cues, breast massage and to lay infant in bassinet if she becomes sleepy.  There is no support person present.  Mother verbalized understanding and will call as needed for assistance.   Maternal Data Formula Feeding for Exclusion: No Has patient been taught Hand Expression?: Yes Does the patient have breastfeeding experience prior to this delivery?: Yes  Feeding Feeding Type: Breast Fed Length of feed: 5 min  LATCH Score Latch: Too sleepy or reluctant, no latch achieved, no sucking elicited.  Audible Swallowing: None  Type of Nipple: Everted at rest and after stimulation  Comfort (Breast/Nipple): Soft / non-tender  Hold (Positioning): Assistance needed to correctly position infant at breast and maintain latch.  LATCH Score: 5  Interventions Interventions: Breast feeding basics reviewed;Assisted with latch;Skin to skin;Breast massage;Position options;Support pillows;Adjust position;Breast compression  Lactation Tools Discussed/Used WIC Program: No   Consult  Status Consult Status: Follow-up Date: 04/01/18 Follow-up type: In-patient    Consetta Cosner R Ervan Heber 03/31/2018, 2:54 AM

## 2018-04-01 ENCOUNTER — Encounter (HOSPITAL_COMMUNITY): Payer: Self-pay | Admitting: *Deleted

## 2018-04-01 NOTE — Discharge Summary (Signed)
OB Discharge Summary     Patient Name: Meagan Moore DOB: 08/24/1981 MRN: [id number]  Date of admission: 03/30/2018 Delivering MD: Pincus Large   Date of discharge: 04/01/2018  Admitting diagnosis: 40 WKS, CTXS Intrauterine pregnancy: [redacted]w[redacted]d     Secondary diagnosis:  Active Problems:   Normal labor  Additional problems: none     Discharge diagnosis: Term Pregnancy Delivered                                                                                                Post partum procedures:none  Augmentation: none  Complications: None  Hospital course:  Onset of Labor With Vaginal Delivery     37 y.o. yo [id number] at [redacted]w[redacted]d was admitted in Active Labor on 03/30/2018. Patient had an uncomplicated labor course as follows:  Membrane Rupture Time/Date: 6:00 PM ,03/30/2018   Intrapartum Procedures: Episiotomy: None [1]                                         Lacerations:  None [1]  Patient had a delivery of a Viable infant. 03/30/2018  Information for the patient's newborn:  Starr, Urias [[id number]]  Delivery Method: Vaginal, Spontaneous(Filed from Delivery Summary)    Pateint had an uncomplicated postpartum course.  She is ambulating, tolerating a regular diet, passing flatus, and urinating well. Patient is discharged home in stable condition on 04/01/18.   Physical exam  Vitals:   03/31/18 0500 03/31/18 1129 03/31/18 1758 04/01/18 0606  BP: 107/62 108/60 (!) 107/54 116/65  Pulse: 72 97 85 82  Resp:  Temp: 98.3 F (36.8 C) 98.1 F (36.7 C) 98.4 F (36.9 C) 97.8 F (36.6 C)  TempSrc: Oral Oral Oral Oral  Weight:      Height:       General: alert, cooperative and no distress Lochia: appropriate Uterine Fundus: firm Incision: N/A DVT Evaluation: No evidence of DVT seen on physical exam. Labs: Lab Results  Component Value Date   WBC 9.0 03/30/2018   HGB 12.1 03/30/2018   HCT 38.2 03/30/2018   MCV 91.8 03/30/2018   PLT  191 03/30/2018   CMP Latest Ref Rng & Units 01/03/2017  Glucose 65 - 99 mg/dL 664(Q)  BUN 6 - 20 mg/dL 8  Creatinine 0.34 - 7.42 mg/dL 5.95  Sodium 638 - 756 mmol/L 138  Potassium 3.5 - 5.1 mmol/L 4.5  Chloride 101 - 111 mmol/L 104  CO2 22 - 32 mmol/L 26  Calcium 8.9 - 10.3 mg/dL 9.6    Discharge instruction: per After Visit Summary and "Baby and Me Booklet".  After visit meds:  Allergies as of 04/01/2018   No Known Allergies     Medication List    TAKE these medications   acetaminophen 325 MG tablet Commonly known as:  TYLENOL Take 650 mg by mouth every 6 (six) hours as needed for mild pain or headache.   PRENATAL VITAMIN PO Take 1 tablet by mouth daily.  Diet: routine diet  Activity: Advance as tolerated. Pelvic rest for 6 weeks.   Outpatient follow up:6 weeks Follow up Appt:No future appointments. Follow up Visit:No follow-ups on file.  Postpartum contraception: Undecided  Newborn Data: Live born female  Birth Weight: 8 lb 8.3 oz (3865 g) APGAR: 9, 9  Newborn Delivery   Birth date/time:  03/30/2018 21:49:00 Delivery type:  Vaginal, Spontaneous     Baby Feeding: Breast Disposition:home with mother   04/01/2018 Felisa Bonier, MD, PGY-1 Family Medicine - [facility] [place]

## 2018-04-01 NOTE — Lactation Note (Signed)
This note was copied from a baby's chart. Lactation Consultation Note  Patient Name: Meagan Moore [id number] Date: 04/01/2018   RN Request for Follow Up:  RN suspects infant may have a tongue tie.  Upon entering room to assess baby was sleeping in mother's lap.  When questioned about breastfeeding, mother stated she felt like everything was fine with the latch but her nipples were a little bit sore.  I did note her nipples to be reddened and irritated.  No breakdown noted.  Comfort gels provided with instructions for use.  I helped mother apply gels.  I offered to assess baby's tongue but mother declined and stated that baby had been awake all day and she wanted to let her sleep.  I suggested she have the pediatrician assess on morning rounds.  Mother verbalized understanding and informed me that one of her other children had tongue tie.  There was nothing done with the other child's frenulum and infant continued to breastfeed successfully.                   Meagan Moore R Meagan Moore 04/01/2018, 5:36 AM

## 2018-04-02 ENCOUNTER — Ambulatory Visit: Payer: Self-pay

## 2018-04-02 NOTE — Lactation Note (Signed)
This note was copied from a baby's chart. Lactation Consultation Note  Patient Name: Girl Larhonda Dettloff WGNFA'O Date: 04/02/2018   Visited with P5 Mom of 57 hr old term baby at 10% weight loss.  Mom engorged this am, and icing breasts for 20 mins between feedings or pumpings.    Mom doesn't want to latch baby until her breasts soften some.    Set up DEBP and assisted Mom to double pump, 15 ml expressed.  Mom to ice and pump again until breasts are softening some.    Pediatrican requested baby to be re-weighed this afternoon.    Asked Mom to call when baby is to be breastfed for RN or LC to assess latch.   Judee Clara 04/02/2018, 12:35 PM

## 2018-04-02 NOTE — Lactation Note (Signed)
This note was copied from a baby's chart. Lactation Consultation Note  Patient Name: Meagan Moore [id number] Date: 04/02/2018 Reason for consult: Follow-up assessment;Difficult latch;Infant weight loss;Engorgement  Assisted with latch in cross cradle hold and football hold.  Baby 64 hrs old, and at 9.9% weight loss.  Baby reweighed this afternoon after weight was 9.7% this am.   Using Dexter the video interpreter. Mom had fed baby her EBM by bottle an hour ago.  Mom taught to pump every 2-3 hrs both breasts using DEBP.  Mom used the hand pump.   Both breasts full, left breast more engorged as baby hadn't been latching onto that side.  Nipples are large in diameter.    Baby latching onto nipple only, and popping off the breast.  Repeatedly tried sandwiching breast to help baby latch on deeply.  Initiated a 24 mm nipple shield, nipple pulled well into shield.  Baby would not latch on, and very fussy.  When baby assisted to latch on without nipple shield in a different position, baby sucked for a few minutes, unable to identify swallows.  Nipple pulled and pinched on upper edge.  Baby noted to have a short lingual frenulum.  Baby cups her tongue, center unable to elevate far above floor.  Mom explained that sometimes the tongue can be restricted by a short frenulum and have a difficult time transferring milk from breast. MD notified of this.  Assisted Mom to double pump on regular setting of pump.  Massaged left breast while Mom pumping to increase milk flow.  Right breast producing more milk, and is softer on palpation.   Plan- 1- Try to latch baby to breast every 3 hrs or sooner if baby cues, ask for assistance as needed. 2- pump both breasts 20 mins following breast massage  3- feed baby 45-60 ml EBM+/formula by slow flow nipple. 4- Keep baby STS as much as possible 5-WIC DEBP or WIC loaner from Lactation on discharge. 6- if breasts become engorged, to ice for 20 mins and  double pump.   Feeding Feeding Type: Breast Fed Nipple Type: Slow - flow Length of feed: 5 min(with and without nipple shield)  LATCH Score Latch: Repeated attempts needed to sustain latch, nipple held in mouth throughout feeding, stimulation needed to elicit sucking reflex.  Audible Swallowing: None  Type of Nipple: Everted at rest and after stimulation  Comfort (Breast/Nipple): Filling, red/small blisters or bruises, mild/mod discomfort  Hold (Positioning): Full assist, staff holds infant at breast  LATCH Score: 4  Interventions Interventions: Breast feeding basics reviewed;Assisted with latch;Skin to skin;Breast massage;Hand express;Expressed milk;Position options;Support pillows;Adjust position;Breast compression;DEBP;Hand pump;Ice  Lactation Tools Discussed/Used Tools: Nipple Dorris Carnes;Bottle;Pump Nipple shield size: 24 Breast pump type: Double-Electric Breast Pump WIC Program: Yes   Consult Status Consult Status: Follow-up Date: 04/03/18 Follow-up type: In-patient    Judee Clara 04/02/2018, 2:44 PM

## 2018-04-03 ENCOUNTER — Ambulatory Visit: Payer: Self-pay

## 2018-04-03 NOTE — Lactation Note (Signed)
This note was copied from a baby's chart. Lactation Consultation Note  Patient Name: Meagan Moore WUJWJ'X Date: 04/03/2018 Reason for consult: Follow-up assessment;Difficult latch;Infant weight loss Video interpreter used. Breasts are engorged this AM.  Mom has been using ice packs and pumping every 2 hours.  Obtaining small amounts of milk.  Assisted with breast massaged during pumping and flange size increased to a 30 mm.  Mom states baby is not latching on left side.  Assisted with positioning baby in football hold.  Baby unable to latch so a 24 mm nipple shield applied.  Baby latched easily but fell asleep after a few minutes.  Instructed to continue pumping and give any expressed milk back to baby. Additional formula will be used until volume increases.  Mom will decide if she will go home with a manual pump or [facility] loaner.  Maternal Data    Feeding Feeding Type: Breast Fed  LATCH Score Latch: Repeated attempts needed to sustain latch, nipple held in mouth throughout feeding, stimulation needed to elicit sucking reflex.  Audible Swallowing: A few with stimulation  Type of Nipple: Everted at rest and after stimulation  Comfort (Breast/Nipple): Soft / non-tender  Hold (Positioning): Assistance needed to correctly position infant at breast and maintain latch.  LATCH Score: 7  Interventions Interventions: Assisted with latch;Breast compression;Skin to skin;Adjust position;Breast massage;Support pillows;Hand express  Lactation Tools Discussed/Used Tools: Nipple Shields Nipple shield size: 24   Consult Status      Huston Foley 04/03/2018, 9:42 AM

## 2019-10-05 IMAGING — US US MFM OB LIMITED
1 series · 15 of 28 positions shown · non-contrast
Comparison: none

[Series 1: us mfm ob limited · 51 acquisitions, 15 frames shown]
[im 1/51]
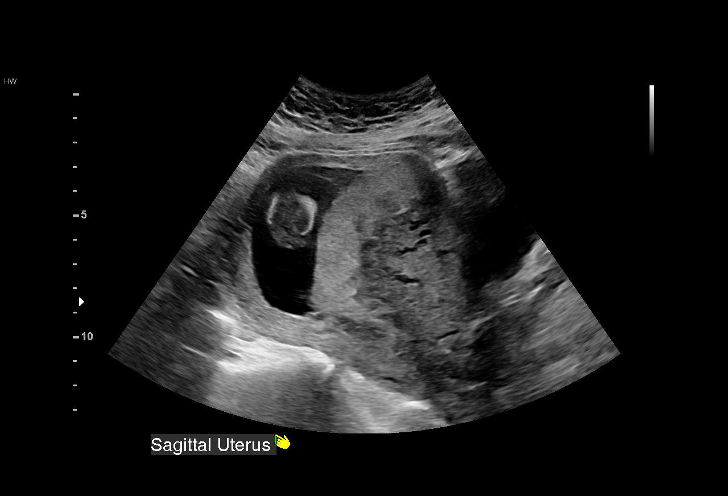
[im 4/51]
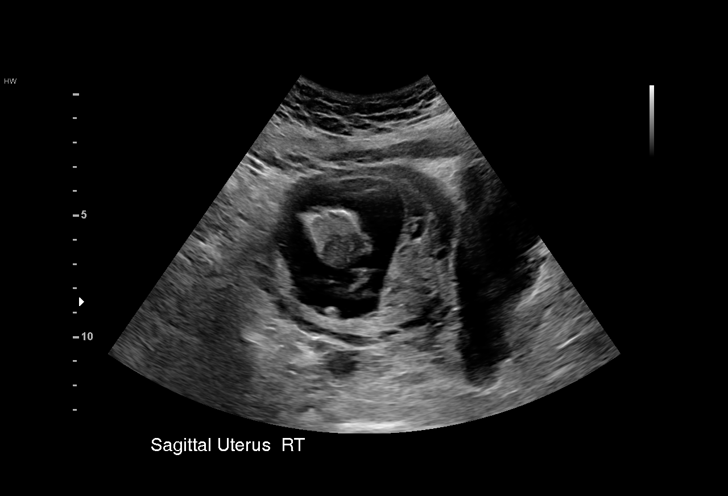
[im 8/51]
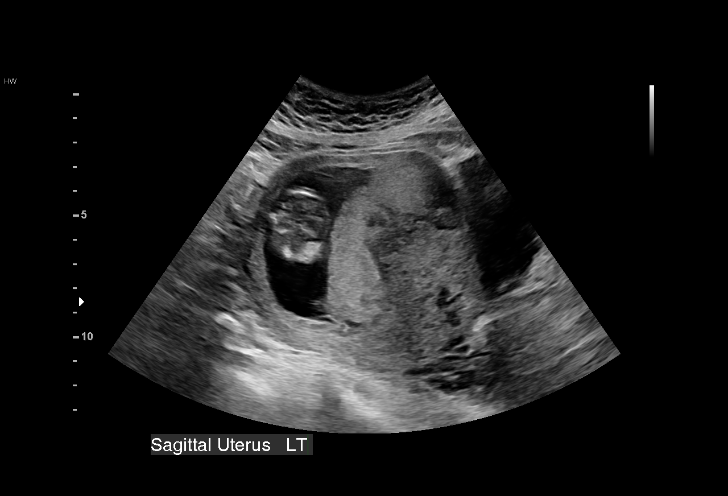
[im 12/51]
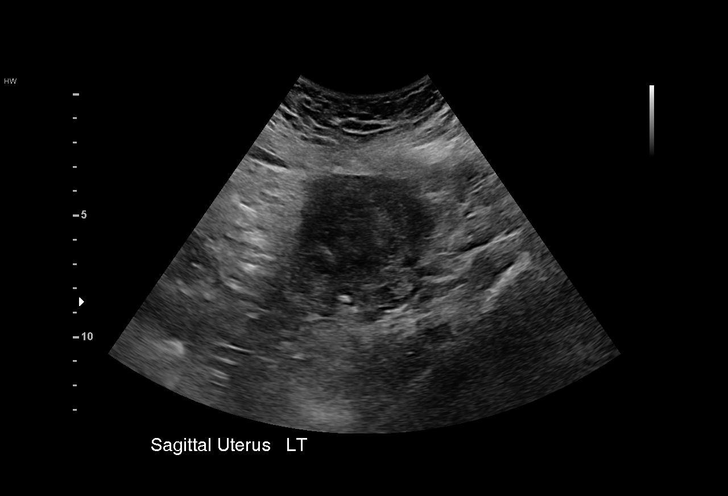
[im 15/51]
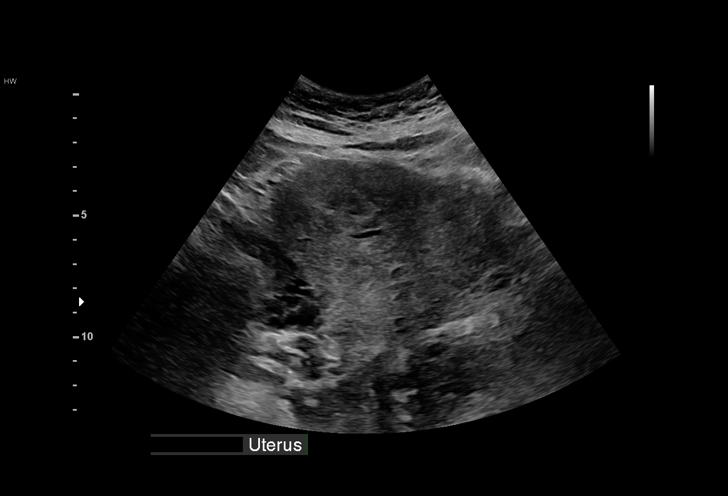
[im 19/51]
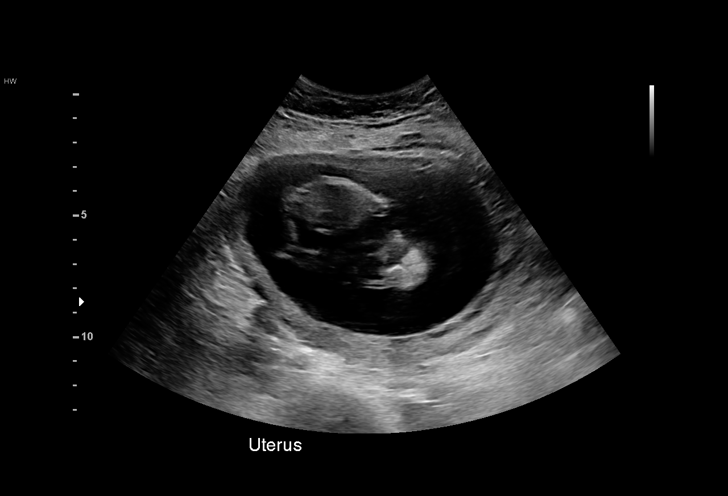
[im 23/51]
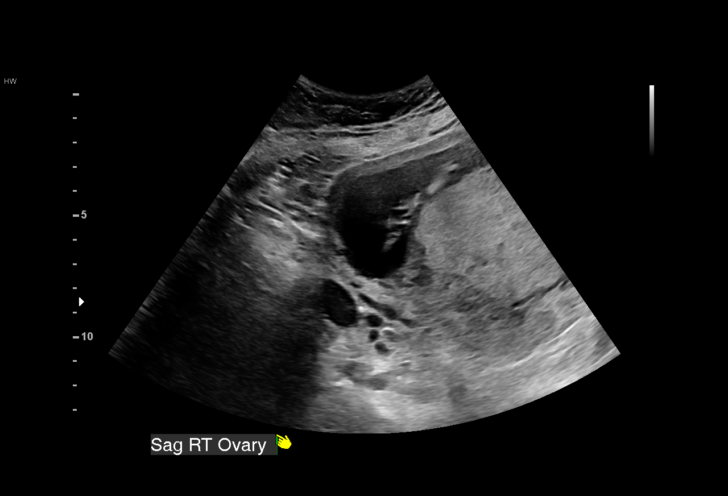
[im 26/51]
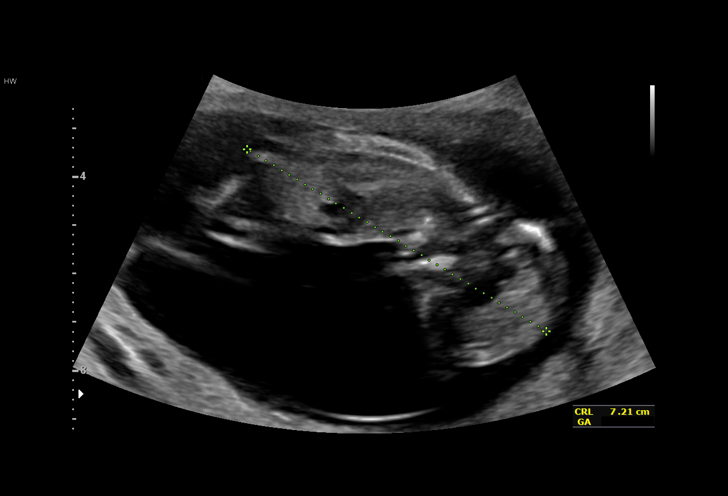
[im 28/51]
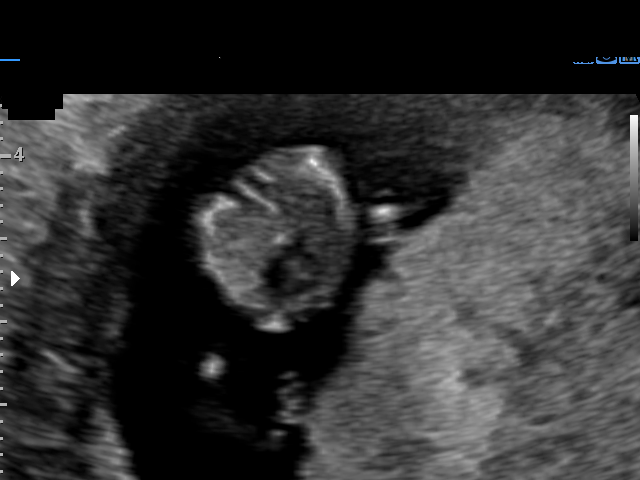
[im 32/51]
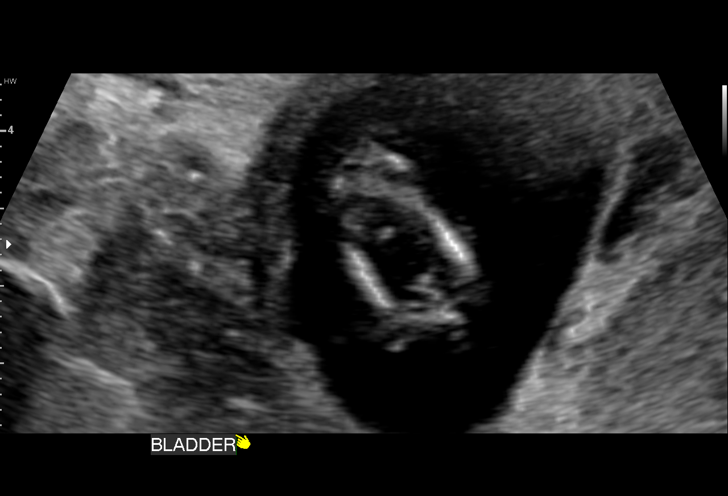
[im 36/51]
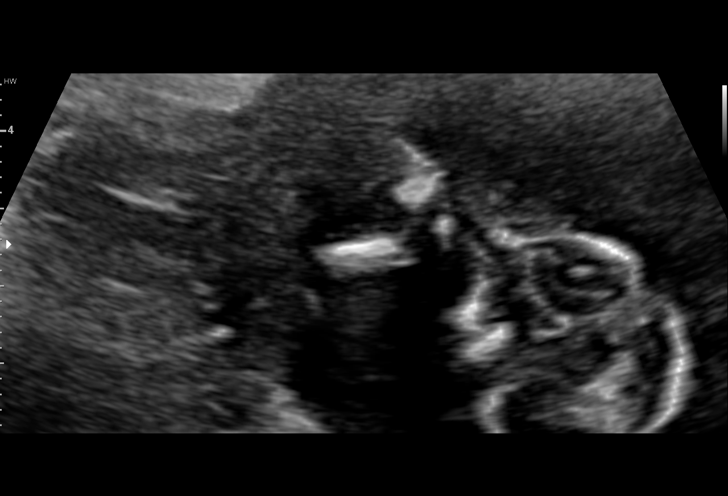
[im 39/51]
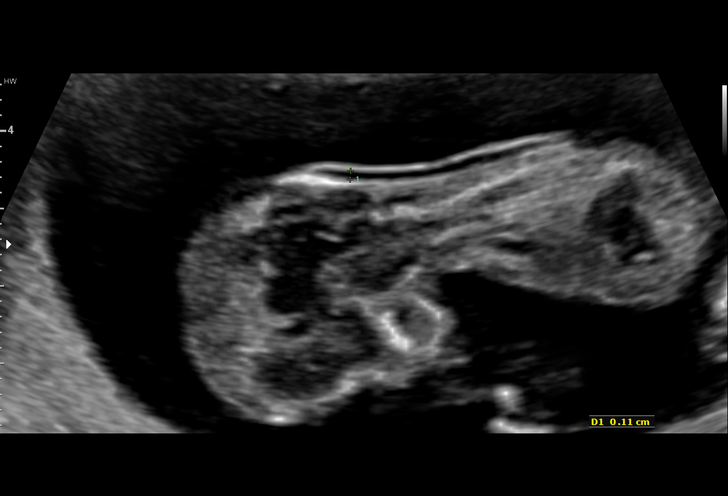
[im 43/51]
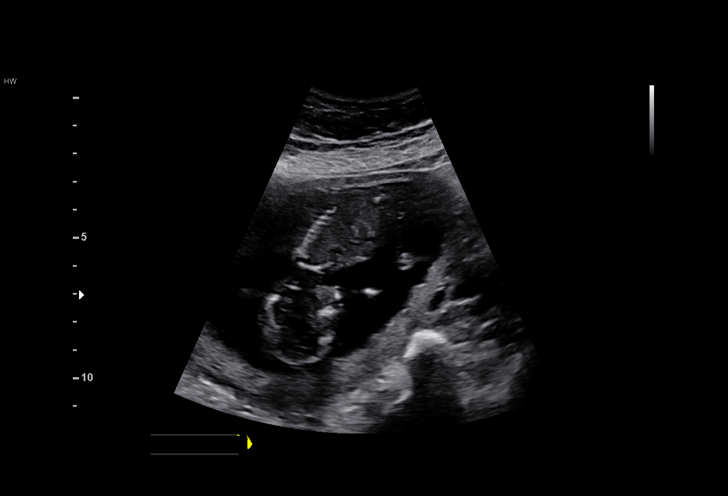
[im 47/51]
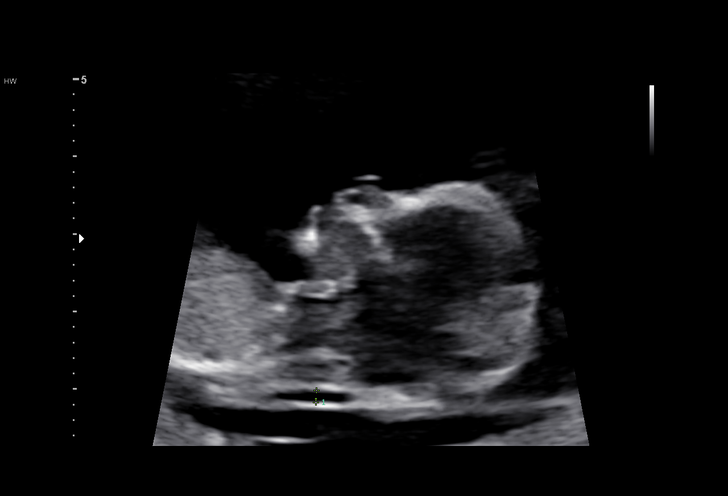
[im 51/51]
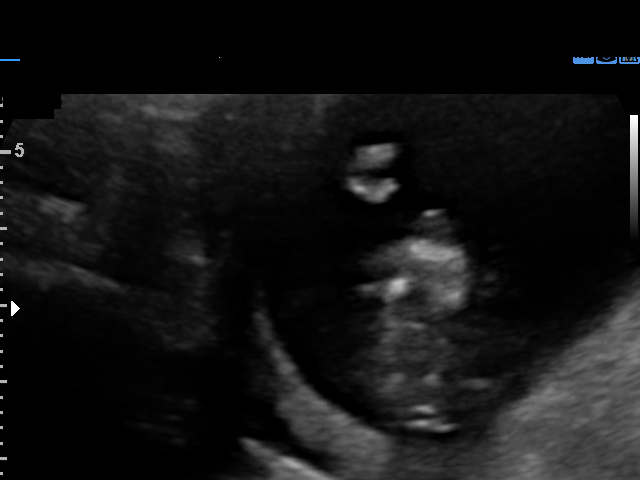

[15 of 28 positions shown; findings below may reference images not displayed]

[REDACTED] Health-
Faculty Physician

1  ZINKENG              [id number]      [phone]     [phone]
Indications

13 weeks gestation of pregnancy
Encounter for nuchal translucency
Advanced maternal age multigravida 35+,
first trimester
OB History

Blood Type:            Height:  5'6"   Weight (lb):  185       BMI:
Gravidity:    5         Term:   4        Prem:   0        SAB:   0
TOP:          0       Ectopic:  0        Living: 4
Fetal Evaluation

Num Of Fetuses:     1
Preg. Location:     Intrauterine
Gest. Sac:          Intrauterine
Yolk Sac:           Not visualized
Fetal Pole:         Visualized
Fetal Heart         155
Rate(bpm):
Cardiac Activity:   Observed
Presentation:       Variable
Placenta:           Anterior

Amniotic Fluid
AFI FV:      Subjectively within normal limits
Biometry
CRL:      76.3  mm     G. Age:  13w 3d                  EDD:   03/30/18
Gestational Age

LMP:           13w 4d        Date:  06/22/17                 EDD:   03/29/18
Best:          13w 4d     Det. By:  LMP  (06/22/17)          EDD:   03/29/18
Anatomy

Cranium:               Appears normal         Bladder:                Appears normal
Choroid Plexus:        Appears normal         Upper Extremities:      Visualized
Thoracic:              Appears normal         Lower Extremities:      Visualized
Stomach:               Appears normal, left
sided
Cervix Uterus Adnexa

Cervix
Normal appearance by transabdominal scan.

Uterus
No abnormality visualized.

Left Ovary
No adnexal mass visualized.

Right Ovary
No adnexal mass visualized.

Cul De Sac:   No free fluid seen.

Adnexa:       No abnormality visualized.
Impression

SIUP at 13+4 weeks
No gross abnormalities identified
NT measurement was within normal limits for this GA; NB
present
Normal amniotic fluid volume
Measurements consistent with LMP dating

Ms. JIA CHENG SEWU declined first trimester screening and
genetic counseling today.
Recommendations

Offer [facility]SAFP in the second trimester for ONTD screening
Offer detailed U/S by 18 weeks

## 2022-05-11 ENCOUNTER — Encounter (HOSPITAL_COMMUNITY): Payer: Self-pay

## 2022-05-11 ENCOUNTER — Telehealth: Payer: Self-pay

## 2022-05-11 ENCOUNTER — Emergency Department (HOSPITAL_COMMUNITY)
Admission: EM | Admit: 2022-05-11 | Discharge: 2022-05-11 | Disposition: A | Discharge status: Home / Self Care | Payer: Self-pay | Attending: Emergency Medicine | Admitting: Emergency Medicine

## 2022-05-11 ENCOUNTER — Other Ambulatory Visit: Payer: Self-pay

## 2022-05-11 DIAGNOSIS — N611 Abscess of the breast and nipple: Secondary | ICD-10-CM | POA: Insufficient documentation

## 2022-05-11 DIAGNOSIS — L539 Erythematous condition, unspecified: Secondary | ICD-10-CM

## 2022-05-11 DIAGNOSIS — N61 Mastitis without abscess: Secondary | ICD-10-CM | POA: Insufficient documentation

## 2022-05-11 MED ORDER — CLINDAMYCIN HCL 150 MG PO CAPS: 450.0000 mg | ORAL_CAPSULE | Freq: Three times a day (TID) | ORAL | 0 refills | Status: AC

## 2022-05-11 NOTE — ED Triage Notes (Signed)
Pt c/o breast pain. Pt has a 43 year old child that she is still breast feeding and the child will only drink from the right breast. Pt states the left breast is hurting. Pt's left breast is erythematous at the bottom of the breast and hot to touch, swollen. Pt states this has been going on for three weeks. Pt states this happened once before, because the milk got stuck and the milk got hard.

## 2022-05-11 NOTE — Telephone Encounter (Signed)
RNCM called patient using language line (Operator Toniann Fail: [phone]) to advise of schedule PCP f/u appt on 05/14/22 at 10:40 am. Reached message that voicemail is full, unable to leave call back information.

## 2022-05-14 ENCOUNTER — Ambulatory Visit: Payer: Self-pay | Admitting: Nurse Practitioner
